# Patient Record
Sex: Female | Born: 1960 | Race: Black or African American | Hispanic: No | Smoking: Former smoker
Health system: Southern US, Community
[De-identification: ages and names within clinical notes are randomized; demographics above are authoritative.]

## PROBLEM LIST (undated history)

## (undated) DIAGNOSIS — E119 Type 2 diabetes mellitus without complications: Secondary | ICD-10-CM

## (undated) DIAGNOSIS — F329 Major depressive disorder, single episode, unspecified: Secondary | ICD-10-CM

## (undated) DIAGNOSIS — F419 Anxiety disorder, unspecified: Secondary | ICD-10-CM

## (undated) DIAGNOSIS — B009 Herpesviral infection, unspecified: Secondary | ICD-10-CM

## (undated) DIAGNOSIS — F32A Depression, unspecified: Secondary | ICD-10-CM

## (undated) DIAGNOSIS — J302 Other seasonal allergic rhinitis: Secondary | ICD-10-CM

## (undated) HISTORY — PX: TUBAL LIGATION: SHX77

## (undated) HISTORY — PX: CYST EXCISION: SHX5701

---

## 2013-12-29 ENCOUNTER — Emergency Department (HOSPITAL_COMMUNITY): Payer: 59

## 2013-12-29 ENCOUNTER — Encounter (HOSPITAL_COMMUNITY): Payer: Self-pay | Admitting: Emergency Medicine

## 2013-12-29 ENCOUNTER — Emergency Department (HOSPITAL_COMMUNITY)
Admission: EM | Admit: 2013-12-29 | Discharge: 2013-12-30 | Disposition: A | Discharge status: Home / Self Care | Payer: 59 | Attending: Emergency Medicine | Admitting: Emergency Medicine

## 2013-12-29 DIAGNOSIS — Z87891 Personal history of nicotine dependence: Secondary | ICD-10-CM | POA: Insufficient documentation

## 2013-12-29 DIAGNOSIS — R0602 Shortness of breath: Secondary | ICD-10-CM | POA: Insufficient documentation

## 2013-12-29 DIAGNOSIS — R079 Chest pain, unspecified: Secondary | ICD-10-CM | POA: Diagnosis present

## 2013-12-29 DIAGNOSIS — R002 Palpitations: Secondary | ICD-10-CM | POA: Diagnosis present

## 2013-12-29 DIAGNOSIS — R0789 Other chest pain: Secondary | ICD-10-CM | POA: Insufficient documentation

## 2013-12-29 DIAGNOSIS — Z79899 Other long term (current) drug therapy: Secondary | ICD-10-CM | POA: Insufficient documentation

## 2013-12-29 HISTORY — DX: Other seasonal allergic rhinitis: J30.2

## 2013-12-29 LAB — BASIC METABOLIC PANEL
BUN: 6 mg/dL (ref 6–23)
CHLORIDE: 103 meq/L (ref 96–112)
CO2: 25 meq/L (ref 19–32)
CREATININE: 0.78 mg/dL (ref 0.50–1.10)
Calcium: 9.3 mg/dL (ref 8.4–10.5)
GFR calc Af Amer: >90 mL/min (ref >90)
GFR calc non Af Amer: >90 mL/min (ref >90)
Glucose, Bld: 105 mg/dL — ABNORMAL HIGH (ref 70–99)
Potassium: 3.6 mEq/L — ABNORMAL LOW (ref 3.7–5.3)
Sodium: 142 mEq/L (ref 137–147)

## 2013-12-29 LAB — CBC
HEMATOCRIT: 37.6 % (ref 36.0–46.0)
HEMOGLOBIN: 12.3 g/dL (ref 12.0–15.0)
MCH: 23 pg — ABNORMAL LOW (ref 26.0–34.0)
MCHC: 32.7 g/dL (ref 30.0–36.0)
MCV: 70.4 fL — AB (ref 78.0–100.0)
Platelets: 296 10*3/uL (ref 150–400)
RBC: 5.34 MIL/uL — ABNORMAL HIGH (ref 3.87–5.11)
RDW: 14.9 % (ref 11.5–15.5)
WBC: 12.9 10*3/uL — AB (ref 4.0–10.5)

## 2013-12-29 LAB — PRO B NATRIURETIC PEPTIDE: Pro B Natriuretic peptide (BNP): 70 pg/mL (ref 0–125)

## 2013-12-29 LAB — I-STAT TROPONIN, ED: Troponin i, poc: 0 ng/mL (ref 0.00–0.08)

## 2013-12-29 LAB — TROPONIN I

## 2013-12-29 MED ORDER — OXYCODONE-ACETAMINOPHEN 5-325 MG PO TABS
1.0000 | ORAL_TABLET | Freq: Once | ORAL | Status: AC
  Administered 2013-12-29: 1 via ORAL
  Filled 2013-12-29: qty 1

## 2013-12-29 NOTE — ED Provider Notes (Signed)
CSN: [id number]     Arrival date & time 12/29/13  1933 History   First MD Initiated Contact with Patient 12/29/13 2028     Chief Complaint  Patient presents with  . Chest Pain     (Consider location/radiation/quality/duration/timing/severity/associated sxs/prior Treatment) Patient is a 53 y.o. female presenting with chest pain. The history is provided by the patient.  Chest Pain Pain location:  L chest Pain quality: pressure   Pain radiates to:  Does not radiate Pain radiates to the back: no   Pain severity:  Mild Onset quality:  Gradual Duration:  1 day Timing:  Intermittent Progression:  Unchanged Chronicity:  New Context: at rest   Relieved by:  Nothing Worsened by:  Nothing tried Ineffective treatments:  None tried Associated symptoms: shortness of breath (mild)   Associated symptoms: no abdominal pain, no back pain, no cough, no dizziness, no fatigue, no fever, no headache, no nausea and not vomiting     Past Medical History  Diagnosis Date  . Seasonal allergies    Past Surgical History  Procedure Laterality Date  . Cyst excision     No family history on file. History  Substance Use Topics  . Smoking status: Never Smoker   . Smokeless tobacco: Not on file  . Alcohol Use: No   OB History   Grav Para Term Preterm Abortions TAB SAB Ect Mult Living                 Review of Systems  Constitutional: Negative for fever and fatigue.  HENT: Negative for congestion and drooling.   Eyes: Negative for pain.  Respiratory: Positive for shortness of breath (mild). Negative for cough.   Cardiovascular: Positive for chest pain.  Gastrointestinal: Negative for nausea, vomiting, abdominal pain and diarrhea.  Genitourinary: Negative for dysuria and hematuria.  Musculoskeletal: Negative for back pain, gait problem and neck pain.  Skin: Negative for color change.  Neurological: Negative for dizziness and headaches.  Hematological: Negative for adenopathy.   Psychiatric/Behavioral: Negative for behavioral problems.  All other systems reviewed and are negative.      Allergies  Sulfa antibiotics  Home Medications   Current Outpatient Rx  Name  Route  Sig  Dispense  Refill  . albuterol (PROVENTIL HFA;VENTOLIN HFA) 108 (90 BASE) MCG/ACT inhaler   Inhalation   Inhale 1-2 puffs into the lungs every 6 (six) hours as needed for wheezing or shortness of breath.         . estradiol (ESTRACE) 1 MG tablet   Oral   Take 1 mg by mouth daily.         . fluticasone (FLONASE) 50 MCG/ACT nasal spray   Each Nare   Place 2 sprays into both nostrils daily as needed for allergies or rhinitis.         Marland Kitchen ibuprofen (ADVIL,MOTRIN) 200 MG tablet   Oral   Take 200 mg by mouth daily as needed for mild pain.          BP 120/96  Pulse 84  Temp(Src) 99.1 F (37.3 C) (Oral)  Resp 18  SpO2 97% Physical Exam  Nursing note and vitals reviewed. Constitutional: She is oriented to person, place, and time. She appears well-developed and well-nourished.  HENT:  Head: Normocephalic.  Mouth/Throat: No oropharyngeal exudate.  Eyes: Conjunctivae and EOM are normal. Pupils are equal, round, and reactive to light.  Neck: Normal range of motion. Neck supple.  Cardiovascular: Normal rate, regular rhythm, normal heart sounds and intact  distal pulses.  Exam reveals no gallop and no friction rub.   No murmur heard. Pulmonary/Chest: Effort normal and breath sounds normal. No respiratory distress. She has no wheezes.  Abdominal: Soft. Bowel sounds are normal. There is no tenderness. There is no rebound and no guarding.  Musculoskeletal: Normal range of motion. She exhibits no edema and no tenderness.  Neurological: She is alert and oriented to person, place, and time.  Skin: Skin is warm and dry.  Psychiatric: She has a normal mood and affect. Her behavior is normal.    ED Course  Procedures (including critical care time) Labs Review Labs Reviewed  CBC -  Abnormal; Notable for the following:    WBC 12.9 (*)    RBC 5.34 (*)    MCV 70.4 (*)    MCH 23.0 (*)    All other components within normal limits  BASIC METABOLIC PANEL - Abnormal; Notable for the following:    Potassium 3.6 (*)    Glucose, Bld 105 (*)    All other components within normal limits  PRO B NATRIURETIC PEPTIDE  I-STAT TROPOININ, ED   Imaging Review Dg Chest 2 View  12/29/2013   CLINICAL DATA:  Chest pain  EXAM: CHEST  2 VIEW  COMPARISON:  None.  FINDINGS: Lungs are clear. Heart size and pulmonary vascularity are normal. No adenopathy. No pneumothorax. There is midthoracic dextroscoliosis.  IMPRESSION: Scoliosis.  No edema or consolidation.   Electronically Signed   By: Bretta Bang M.D.   On: 12/29/2013 20:21     EKG Interpretation   Date/Time:  Friday December 29 2013 19:39:15 EDT Ventricular Rate:  79 PR Interval:  156 QRS Duration: 72 QT Interval:  384 QTC Calculation: 440 R Axis:   58 Text Interpretation:  Normal sinus rhythm Normal ECG no previous for  comparison Confirmed by Donis Pinder  MD, Ryeleigh Santore ([id number]) on 12/29/2013 8:46:05  PM      MDM   Final diagnoses:  Chest pain  Palpitations    9:07 PM 53 y.o. female who presents with palpitations and chest pressure which began yesterday while at rest. She notes that her symptoms persisted for several hours. She notes that she has had intermittent symptoms today. She currently rates her pain as a 4/10. She is afebrile and vital signs are unremarkable here. She was a previous smoker, has a family history of heart disease. Wells neg. she appears well on exam. Will get screening labwork and pain control with a Percocet.  12:03 AM: Pt feeling better. Labs non-contrib. Delta trop neg. Low risk for MACE per HEART score. Will provide pain medicine at pt's request and rec close f/u w/ pcp.  I have discussed the diagnosis/risks/treatment options with the patient and believe the pt to be eligible for discharge home to  follow-up with pcp next week. We also discussed returning to the ED immediately if new or worsening sx occur. We discussed the sx which are most concerning (e.g., worsening pain, sob, fever) that necessitate immediate return. Medications administered to the patient during their visit and any new prescriptions provided to the patient are listed below.  Medications given during this visit Medications  oxyCODONE-acetaminophen (PERCOCET/ROXICET) 5-325 MG per tablet 1 tablet (1 tablet Oral Given 12/29/13 2110)    Discharge Medication List as of 12/30/2013 12:16 AM    START taking these medications   Details  oxyCODONE-acetaminophen (PERCOCET) 5-325 MG per tablet Take 1 tablet by mouth every 6 (six) hours as needed for moderate pain., Starting 12/30/2013,  Until Discontinued, Print         Junius ArgyleForrest S Jaqueline Uber, MD 12/30/13 1316

## 2013-12-29 NOTE — ED Notes (Signed)
Pt. C/o chest pain started last night. States it was intermittent but has been continuous for the past 4 hours. States "It feels like fluttering in my chest", also reports tenderness to left chest. Reports nausea and increase in pain with deep breathing. Denies hx of heart problems.

## 2013-12-29 NOTE — ED Notes (Signed)
Called Christiane HaJonathan [phone] (pt's husband) per pt's request and notified him that pt is here.

## 2013-12-29 NOTE — ED Notes (Signed)
Pt. reports intermittent mid / left chest pain with SOB /nausea and diaphoresis onset last night .

## 2013-12-30 DIAGNOSIS — R002 Palpitations: Secondary | ICD-10-CM | POA: Diagnosis present

## 2013-12-30 DIAGNOSIS — R079 Chest pain, unspecified: Secondary | ICD-10-CM | POA: Diagnosis present

## 2013-12-30 MED ORDER — OXYCODONE-ACETAMINOPHEN 5-325 MG PO TABS: 1.0000 | ORAL_TABLET | Freq: Four times a day (QID) | ORAL | Status: DC | PRN

## 2013-12-30 NOTE — Discharge Instructions (Signed)
°Emergency Department Resource Guide °1) Find a Doctor and Pay Out of Pocket °Although you won't have to find out who is covered by your insurance plan, it is a good idea to ask around and get recommendations. You will then need to call the office and see if the doctor you have chosen will accept you as a new patient and what types of options they offer for patients who are self-pay. Some doctors offer discounts or will set up payment plans for their patients who do not have insurance, but you will need to ask so you aren't surprised when you get to your appointment. ° °2) Contact Your Local Health Department °Not all health departments have doctors that can see patients for sick visits, but many do, so it is worth a call to see if yours does. If you don't know where your local health department is, you can check in your phone book. The CDC also has a tool to help you locate your state's health department, and many state websites also have listings of all of their local health departments. ° °3) Find a Walk-in Clinic °If your illness is not likely to be very severe or complicated, you may want to try a walk in clinic. These are popping up all over the country in pharmacies, drugstores, and shopping centers. They're usually staffed by nurse practitioners or physician assistants that have been trained to treat common illnesses and complaints. They're usually fairly quick and inexpensive. However, if you have serious medical issues or chronic medical problems, these are probably not your best option. ° °No Primary Care Doctor: °- Call [facility] at  832-8000 - they can help you locate a primary care doctor that  accepts your insurance, provides certain services, etc. °- Physician Referral Service- 1-800-533-3463 ° °Chronic Pain Problems: °Organization         Address  Phone   Notes  °[facility]  (336) 297-2271 Patients need to be referred by their primary care doctor.  ° °Medication  Assistance: °Organization         Address  Phone   Notes  °[place] [facility] [address] °[place], [place] [address] (336) 641-8030 --Must be a resident of [place] °-- Must have NO insurance coverage whatsoever (no Medicaid/ Medicare, etc.) °-- The pt. MUST have a primary care doctor that directs their care regularly and follows them in the community °  °[facility]  (866) 331-1348   °[employer]  (888) 892-1162   ° °Agencies that provide inexpensive medical care: °Organization         Address  Phone   Notes  °[facility]  (336) 832-8035   °[facility]    (336) 832-7272   °[facility] [address] °[place], [place] [address] (336) 832-4777   °[facility] [address], °[place] (336) 271-4999   °[facility]    (336) 373-0678   °[facility]    (336) 272-1050   °[facility] ° [address], [place] Phone:  (336) 832-4444, Fax:  (336) 832-4440 Hours of Operation:  9 am - 6 pm, M-F.  Also accepts Medicaid/Medicare and self-pay.  °[facility] ° [address],  Phone: (336) 832-3150, Fax: (336) 832-3151. Hours of Operation:  8:30 am - 5:30 pm, M-F.  Also accepts Medicaid and self-pay.  °[facility] [place] [address], [place] Phone: (336) 878-6027   °[facility] [address], [place], [place] (336)723-1848, Ext. 123 Mondays & Thursdays: 7-9 AM.  First 15 patients are seen on a first come, first serve basis. °  ° °Medicaid-accepting [place] Providers: ° °Organization         Address  Phone   Notes  °[facility] [address], [place] (336) 641-2100 Also accepts self-pay patients.  °[facility] [address], [place] ° (336) 856-9996   °[facility] [address], [place]  (336) 288-8857   °[facility] [address] [place] [address], [place] (336) 299-7000   °Veita Bland [address], [place]  ° (336) 373-1557 Only accepts [place] Access Medicaid patients after they have their name applied to their card.  ° °Self-Pay (no insurance) in [place]: ° °Organization         Address  Phone   Notes  °Sickle Cell Patients, [facility] [address], [place] (336) 832-1970   °[facility] [address], [place] (336) 832-4400   °[facility] ° [address], [place] (336) 992-4800   °[facility]/Dr. Osei-Bonsu ° [address] [place] [address], [place] or [address], [place] (336) 841-8500 Phone number for both [place] and [place] locations is the same.  °[facility] [address], [place] (336) 299-0000   °[facility] [address] [place] [address], [place] or [address] (336) 852-7530 °(336) 878-2260   °[facility] [address], [place] (336) 350-1642, phone; (336) 294-5005, fax Sees patients 1st and 3rd Saturday of every month.  Must not qualify for public or private insurance (i.e. Medicaid, Medicare, [place] Health Choice, Veterans' Benefits) • Household income should be no more than 200% of the poverty level •The clinic cannot treat you if you are pregnant or think you are pregnant • Sexually transmitted diseases are not treated at the clinic.  ° ° °Dental Care: °Organization         Address  Phone  Notes  °[place] [facility] [address], [place] (336) 641-6152 Accepts children up to age 21 who are enrolled in Medicaid or [place] Health Choice; pregnant women with a Medicaid card; and children who have applied for Medicaid or [place] Health Choice, but were declined, whose parents can pay a reduced fee at time of service.  °[place]  [facility] [place]  [address], [place] (336) 641-7733 Accepts children up to age 21 who are enrolled in Medicaid or [place] Health Choice; pregnant women with a Medicaid card; and children who have applied for Medicaid or [place] Health Choice, but were declined, whose parents can pay a reduced fee at time of service.  °[facility] ° [address], [place] (336) 641-4533 Patients are seen by appointment only. Walk-ins are not accepted. [facility] will see patients 18 years of age and older. °Monday - Tuesday (8am-5pm) °Most Wednesdays (8:30-5pm) °$30 per visit, cash only  °[facility] ° [address], [place] (336) 641-4533 Patients are seen by appointment only. Walk-ins are not accepted. [facility] will see patients 18 years of age and older. °One   Wednesday Evening (Monthly: Volunteer Based).  $30 per visit, cash only  °[facility]  (919) 537-3737 for adults; Children under age 4, call Graduate Pediatric Dentistry at (919) [phone]. Children aged 4-14, please call (919) [phone] to request a pediatric application. ° Dental services are provided in all areas of dental care including fillings, crowns and bridges, complete and partial dentures, implants, gum treatment, root canals, and extractions. Preventive care is also provided. Treatment is provided to both adults and children. °Patients are selected via a lottery and there is often a waiting list. °  °[facility] [address], °[place] ° (336) 763-8833 [email or web address] °  °[facility] [address], [place], [place] (336)723-1848, Ext. 123 Second and Fourth Thursday of each month, opens at 6:30 AM; Clinic ends at 9 AM.  Patients are seen on a first-come first-served basis, and a limited number are seen during each clinic.  ° °[facility] ° [address], [place], [place] (336) 723-7904    Eligibility Requirements °You must have lived in [place], [place], or [place] counties for at least the last three months. °  You cannot be eligible for state or federal sponsored healthcare insurance, including Veterans Administration, Medicaid, or Medicare. °  You generally cannot be eligible for healthcare insurance through your employer.  °  How to apply: °Eligibility screenings are held every Tuesday and Wednesday afternoon from 1:00 pm until 4:00 pm. You do not need an appointment for the interview!  °[facility] [address], [place], [place] 336-631-2330   °[facility]  336-342-8273   °[facility]  336-703-3100   °[facility]  336-570-6415   ° °Behavioral Health Resources in the Community: °Intensive Outpatient Programs °Organization         Address  Phone  Notes  °[facility] [address], [place], [place] 336-878-6098   °[facility] [address], [place], [place] 336-832-9800   °[facility] [address], [place], [place] ° 336-882-2125   °[facility] [address],  °[place], [place] 1-800-853-5163 or 336-641-4981   °Substance Abuse Resources °Organization         Address  Phone  Notes  °[facility]  336-882-2125   °[facility]  336-784-9470   °[facility]  336-285-9073   °[facility]  336-845-3988   °Residential & Outpatient Substance Abuse Program  1-800-659-3381   °Psychological Services °Organization         Address  Phone  Notes  °[facility]  336- 832-9600   °[facility]  336- 378-7881   °[facility] [address], [place] 1-800-853-5163 or 336-641-4981   ° °Mobile Crisis Teams °Organization         Address  Phone  Notes  °[facility], Mobile Crisis Care Unit  1-877-626-1772   °Assertive °[facility] ° [address]  [place], [place] 336-834-9664   °Sharon DeEsch [address] ° [place] 336-554-5454   ° °Self-Help/Support Groups °Organization         Address  Phone             Notes  °[facility]  - variety of support groups  336- 373-1402 Call for more information  °Narcotics Anonymous (NA), [facility] [address], °[place]  2 meetings at this location  ° °  Residential Treatment Programs °Organization         Address  Phone  Notes  °[facility] [address],    °[place]  1-866-801-8205   °[facility] ° [address], [place], [place] 704-293-8524   °[facility] [address], [place] 336-845-3988 Admissions: 8am-3pm M-F  °[facility] [address]    °[place], [place] 336-841-1104   °[facility] [address], [place], [place] 336-379-7146   °[facility] [address]  °[place], [place] 336-285-9073   °[facility] - Intensive Outpatient [address], [place], [place] 336-852-3033   °[facility] [address]  °[place], [place] 1-877-615-2722 or 336-784-9470   °[facility] [address] [place], [place] 336-227-7417 Accepts Medicaid  °[facility] [address]  °[place] 1-800-659-3381 Substance Abuse/Addiction Treatment  ° °[place] Behavioral Health Resources °Organization         Address  Phone  Notes  °[facility]  (888) 581-9988   °Julie Brannon, PhD [address] [place], [place]   (336) 349-5553 or (336) 951-0000   °[facility]   [address] °[place], [place] (336) 349-4454   °[facility] [address], [place], [place] (336) 342-8316 Insurance/Medicaid/sponsorship through [facility]  °[facility] [address]                                    [place], [place] (336) 342-8316 Therapy/tele-psych/case    °[facility] [address]  ° [place], [place] (336) 349-2233    °Dr. Arfeen  (336) 349-4544   °[facility] [place]  [employer] [place] [facility] 1) [address], [place] °2) [address], [place] °3)  [address], [place] (336) 349-3220 °(336) 342-7768 ° °(336) 342-8140   °[place] Child Abuse [facility] (336) 342-1394 or (336) 342-3537 (After Hours)    ° ° °

## 2014-07-05 ENCOUNTER — Emergency Department (HOSPITAL_COMMUNITY)
Admission: EM | Admit: 2014-07-05 | Discharge: 2014-07-05 | Disposition: A | Discharge status: Home / Self Care | Payer: 59 | Attending: Emergency Medicine | Admitting: Emergency Medicine

## 2014-07-05 ENCOUNTER — Encounter (HOSPITAL_COMMUNITY): Payer: Self-pay | Admitting: Emergency Medicine

## 2014-07-05 DIAGNOSIS — R531 Weakness: Secondary | ICD-10-CM | POA: Insufficient documentation

## 2014-07-05 DIAGNOSIS — Z7951 Long term (current) use of inhaled steroids: Secondary | ICD-10-CM | POA: Insufficient documentation

## 2014-07-05 DIAGNOSIS — R55 Syncope and collapse: Secondary | ICD-10-CM | POA: Diagnosis not present

## 2014-07-05 DIAGNOSIS — Z791 Long term (current) use of non-steroidal anti-inflammatories (NSAID): Secondary | ICD-10-CM | POA: Diagnosis not present

## 2014-07-05 DIAGNOSIS — Z79899 Other long term (current) drug therapy: Secondary | ICD-10-CM | POA: Diagnosis not present

## 2014-07-05 DIAGNOSIS — N390 Urinary tract infection, site not specified: Secondary | ICD-10-CM

## 2014-07-05 DIAGNOSIS — Z792 Long term (current) use of antibiotics: Secondary | ICD-10-CM | POA: Insufficient documentation

## 2014-07-05 DIAGNOSIS — R42 Dizziness and giddiness: Secondary | ICD-10-CM | POA: Diagnosis present

## 2014-07-05 DIAGNOSIS — Z8709 Personal history of other diseases of the respiratory system: Secondary | ICD-10-CM | POA: Insufficient documentation

## 2014-07-05 LAB — CBC
HCT: 37.4 % (ref 36.0–46.0)
Hemoglobin: 12.1 g/dL (ref 12.0–15.0)
MCH: 22.4 pg — ABNORMAL LOW (ref 26.0–34.0)
MCHC: 32.4 g/dL (ref 30.0–36.0)
MCV: 69.1 fL — ABNORMAL LOW (ref 78.0–100.0)
Platelets: 289 10*3/uL (ref 150–400)
RBC: 5.41 MIL/uL — AB (ref 3.87–5.11)
RDW: 14.7 % (ref 11.5–15.5)
WBC: 14 10*3/uL — ABNORMAL HIGH (ref 4.0–10.5)

## 2014-07-05 LAB — URINALYSIS, ROUTINE W REFLEX MICROSCOPIC
BILIRUBIN URINE: NEGATIVE
Glucose, UA: NEGATIVE mg/dL
Ketones, ur: NEGATIVE mg/dL
NITRITE: POSITIVE — AB
Protein, ur: NEGATIVE mg/dL
SPECIFIC GRAVITY, URINE: 1.019 (ref 1.005–1.030)
UROBILINOGEN UA: 0.2 mg/dL (ref 0.0–1.0)
pH: 5 (ref 5.0–8.0)

## 2014-07-05 LAB — URINE MICROSCOPIC-ADD ON

## 2014-07-05 LAB — BASIC METABOLIC PANEL
Anion gap: 12 (ref 5–15)
BUN: 10 mg/dL (ref 6–23)
CHLORIDE: 101 meq/L (ref 96–112)
CO2: 27 mEq/L (ref 19–32)
Calcium: 9.7 mg/dL (ref 8.4–10.5)
Creatinine, Ser: 0.87 mg/dL (ref 0.50–1.10)
GFR calc non Af Amer: 76 mL/min — ABNORMAL LOW (ref >90)
GFR, EST AFRICAN AMERICAN: 88 mL/min — AB (ref >90)
Glucose, Bld: 98 mg/dL (ref 70–99)
Potassium: 3.6 mEq/L — ABNORMAL LOW (ref 3.7–5.3)
Sodium: 140 mEq/L (ref 137–147)

## 2014-07-05 LAB — CBG MONITORING, ED: Glucose-Capillary: 146 mg/dL — ABNORMAL HIGH (ref 70–99)

## 2014-07-05 LAB — I-STAT TROPONIN, ED: Troponin i, poc: 0 ng/mL (ref 0.00–0.08)

## 2014-07-05 MED ORDER — CEPHALEXIN 500 MG PO CAPS: 500.0000 mg | ORAL_CAPSULE | Freq: Three times a day (TID) | ORAL | Status: DC

## 2014-07-05 MED ORDER — SODIUM CHLORIDE 0.9 % IV BOLUS (SEPSIS)
1000.0000 mL | Freq: Once | INTRAVENOUS | Status: AC
  Administered 2014-07-05: 1000 mL via INTRAVENOUS

## 2014-07-05 NOTE — Discharge Instructions (Signed)
Urinary Tract Infection Urinary tract infections (UTIs) can develop anywhere along your urinary tract. Your urinary tract is your body's drainage system for removing wastes and extra water. Your urinary tract includes two kidneys, two ureters, a bladder, and a urethra. Your kidneys are a pair of bean-shaped organs. Each kidney is about the size of your fist. They are located below your ribs, one on each side of your spine. CAUSES Infections are caused by microbes, which are microscopic organisms, including fungi, viruses, and bacteria. These organisms are so small that they can only be seen through a microscope. Bacteria are the microbes that most commonly cause UTIs. SYMPTOMS  Symptoms of UTIs may vary by age and gender of the patient and by the location of the infection. Symptoms in young women typically include a frequent and intense urge to urinate and a painful, burning feeling in the bladder or urethra during urination. Older women and men are more likely to be tired, shaky, and weak and have muscle aches and abdominal pain. A fever may mean the infection is in your kidneys. Other symptoms of a kidney infection include pain in your back or sides below the ribs, nausea, and vomiting. DIAGNOSIS To diagnose a UTI, your caregiver will ask you about your symptoms. Your caregiver also will ask to provide a urine sample. The urine sample will be tested for bacteria and white blood cells. White blood cells are made by your body to help fight infection. TREATMENT  Typically, UTIs can be treated with medication. Because most UTIs are caused by a bacterial infection, they usually can be treated with the use of antibiotics. The choice of antibiotic and length of treatment depend on your symptoms and the type of bacteria causing your infection. HOME CARE INSTRUCTIONS  If you were prescribed antibiotics, take them exactly as your caregiver instructs you. Finish the medication even if you feel better after you  have only taken some of the medication.  Drink enough water and fluids to keep your urine clear or pale yellow.  Avoid caffeine, tea, and carbonated beverages. They tend to irritate your bladder.  Empty your bladder often. Avoid holding urine for long periods of time.  Empty your bladder before and after sexual intercourse.  After a bowel movement, women should cleanse from front to back. Use each tissue only once. SEEK MEDICAL CARE IF:   You have back pain.  You develop a fever.  Your symptoms do not begin to resolve within 3 days. SEEK IMMEDIATE MEDICAL CARE IF:   You have severe back pain or lower abdominal pain.  You develop chills.  You have nausea or vomiting.  You have continued burning or discomfort with urination. MAKE SURE YOU:   Understand these instructions.  Will watch your condition.  Will get help right away if you are not doing well or get worse. Document Released: 06/24/2005 Document Revised: 03/15/2012 Document Reviewed: 10/23/2011 [facility] Patient Information 2015 [place], [place]. This information is not intended to replace advice given to you by your health care provider. Make sure you discuss any questions you have with your health care provider.   Cardiac Event Monitoring A cardiac event monitor is a small recording device used to help detect abnormal heart rhythms (arrhythmias). The monitor is used to record heart rhythm when noticeable symptoms such as the following occur:  Fast heartbeats (palpitations), such as heart racing or fluttering.  Dizziness.  Fainting or light-headedness.  Unexplained weakness. The monitor is wired to two electrodes placed on your  chest. Electrodes are flat, sticky disks that attach to your skin. The monitor can be worn for up to 30 days. You will wear the monitor at all times, except when bathing.  HOW TO USE YOUR CARDIAC EVENT MONITOR A technician will prepare your chest for the electrode placement. The technician  will show you how to place the electrodes, how to work the monitor, and how to replace the batteries. Take time to practice using the monitor before you leave the office. Make sure you understand how to send the information from the monitor to your health care provider. This requires a telephone with a landline, not a cell phone. You need to:  Wear your monitor at all times, except when you are in water:  Do not get the monitor wet.  Take the monitor off when bathing. Do not swim or use a hot tub with it on.  Keep your skin clean. Do not put body lotion or moisturizer on your chest.  Change the electrodes daily or any time they stop sticking to your skin. You might need to use tape to keep them on.  It is possible that your skin under the electrodes could become irritated. To keep this from happening, try to put the electrodes in slightly different places on your chest. However, they must remain in the area under your left breast and in the upper right section of your chest.  Make sure the monitor is safely clipped to your clothing or in a location close to your body that your health care provider recommends.  Press the button to record when you feel symptoms of heart trouble, such as dizziness, weakness, light-headedness, palpitations, thumping, shortness of breath, unexplained weakness, or a fluttering or racing heart. The monitor is always on and records what happened slightly before you pressed the button, so do not worry about being too late to get good information.  Keep a diary of your activities, such as walking, doing chores, and taking medicine. It is especially important to note what you were doing when you pushed the button to record your symptoms. This will help your health care provider determine what might be contributing to your symptoms. The information stored in your monitor will be reviewed by your health care provider alongside your diary entries.  Send the recorded  information as recommended by your health care provider. It is important to understand that it will take some time for your health care provider to process the results.  Change the batteries as recommended by your health care provider. SEEK IMMEDIATE MEDICAL CARE IF:   You have chest pain.  You have extreme difficulty breathing or shortness of breath.  You develop a very fast heartbeat that persists.  You develop dizziness that does not go away.  You faint or constantly feel you are about to faint. Document Released: 06/23/2008 Document Revised: 01/29/2014 Document Reviewed: 03/13/2013 [facility] Patient Information 2015 [place], [place]. This information is not intended to replace advice given to you by your health care provider. Make sure you discuss any questions you have with your health care provider.   Emergency Department Resource Guide 1) Find a Doctor and Pay Out of Pocket Although you won't have to find out who is covered by your insurance plan, it is a good idea to ask around and get recommendations. You will then need to call the office and see if the doctor you have chosen will accept you as a new patient and what types of options they offer  for patients who are self-pay. Some doctors offer discounts or will set up payment plans for their patients who do not have insurance, but you will need to ask so you aren't surprised when you get to your appointment.  2) Contact Your Local Health Department Not all health departments have doctors that can see patients for sick visits, but many do, so it is worth a call to see if yours does. If you don't know where your local health department is, you can check in your phone book. The CDC also has a tool to help you locate your state's health department, and many state websites also have listings of all of their local health departments.  3) Find a Walk-in Clinic If your illness is not likely to be very severe or complicated, you may want to  try a walk in clinic. These are popping up all over the country in pharmacies, drugstores, and shopping centers. They're usually staffed by nurse practitioners or physician assistants that have been trained to treat common illnesses and complaints. They're usually fairly quick and inexpensive. However, if you have serious medical issues or chronic medical problems, these are probably not your best option.  No Primary Care Doctor: - Call Health Connect at  [phone] - they can help you locate a primary care doctor that  accepts your insurance, provides certain services, etc. - Physician Referral Service- [phone]  Chronic Pain Problems: Organization         Address  Phone   Notes  [facility]  [phone] Patients need to be referred by their primary care doctor.   Medication Assistance: Organization         Address  Phone   Notes  [facility] Medication [facility] [address] [place] [place], [place] [address] [phone] --Must be a resident of [facility] -- Must have NO insurance coverage whatsoever (no Medicaid/ Medicare, etc.) -- The pt. MUST have a primary care doctor that directs their care regularly and follows them in the community   [facility]  [phone]   [employer]  [phone]    Agencies that provide inexpensive medical care: Organization         Address  Phone   Notes  [facility]  [phone]   [facility]    [phone]   [facility] [address] [place], [place] [address] [phone]   [facility] [address] [place] [place] [phone]   Planned Parenthood    [phone]   [facility]    [phone]   [facility] and [facility]  [address], [place] Phone:  [phone], Fax:  [phone] Hours of Operation:  9 am - 6 pm, M-F.  Also accepts Medicaid/Medicare and self-pay.  [facility]  [address], [place] Phone: [phone], Fax: [phone]. Hours of Operation:  8:30 am - 5:30 pm, M-F.  Also accepts Medicaid and self-pay.  [facility] [place] [address] [place] Phone: [phone]   [facility] [address] Natasha Bence [place], [place] [phone], Ext. [phone] Mondays & Thursdays: 7-9 AM.  First 15 patients are seen on a first come, first serve basis.    Medicaid-accepting [facility] Providers:  Organization         Address  Phone   Notes  [employer]  [address], [place] [phone] Also accepts self-pay patients.  [facility] [address] Laurell Josephs [place], [place]  [phone]   [facility] [address], [place] [phone]   [facility] [address], [place] [phone]   Renaye Rakers [address] [place] [address], [place]   [phone] Only accepts [place] Access [place] patients after they have their name applied to their card.   Self-Pay (no insurance) in [facility]:  Organization         Address  Phone   Notes  Sickle Cell Patients, [facility] [address] [place], [place] [phone]   [facility]gent Care [address] [place], [place] [phone]   [facility] [place]  [address], [place] [phone]   [facility]/Dr. Osei-Bonsu  [address], [place] or [address], [place] [phone] Phone number for both [place] and [place] locations is the same.  [facility] [address], [place] [phone]   [facility] [address], [place] or [address] [phone]   [facility] [address], [place] [phone], phone; [phone], fax  Sees patients 1st and 3rd Saturday of every month.  Must not qualify for public or private insurance (i.e. Medicaid, Medicare, [place] Health Choice, Veterans' Benefits)  Household income should be no more than 200% of the poverty level The clinic cannot treat you if you are pregnant or think you are pregnant  Sexually transmitted diseases are not treated at the clinic.    Dental Care: Organization         Address  Phone  Notes  [facility] Department of [facility] [address] [place], [place] [phone] Accepts children up to age 2 who are enrolled in [place] or [place] Health Choice; pregnant women with a Medicaid card; and children who have applied for Medicaid or [place] Health Choice, but were declined, whose parents can pay a reduced fee at time of service.  [facility] Department of [facility]  [address], [place] [phone] Accepts children up to age 23 who are enrolled in [place] or [place] Health Choice; pregnant women with a Medicaid card; and children who have applied for Medicaid or [place] Health Choice, but were declined, whose parents can pay a reduced fee at time of service.  [facility]  [address] [place], [place] [phone] Patients are seen by appointment only. Walk-ins are not accepted. [facility] will see patients 41 years of age and older. Monday - Tuesday (8am-5pm) Most Wednesdays (8:30-5pm) $30 per visit, cash only  [facility]  [address], [facility] [phone] Patients are seen by appointment only. Walk-ins are not accepted. [facility] will see patients 76 years of age and older. One Wednesday Evening (Monthly: Volunteer Based).  $30 per visit, cash only  [employer]  [phone] for adults; Children under age 62, call Graduate Pediatric Dentistry at [phone]. Children aged 66-14, please call [phone] to  request a pediatric application.  Dental services are provided in all areas of dental care including fillings, crowns and bridges, complete and partial dentures, implants, gum  treatment, root canals, and extractions. Preventive care is also provided. Treatment is provided to both adults and children. Patients are selected via a lottery and there is often a waiting list.   [facility] [address], [place]  [phone] [email or web address]   [facility] [address] [place], [place] [phone], Ext. [phone] Second and Fourth Thursday of each month, opens at 6:30 AM; Clinic ends at 9 AM.  Patients are seen on a first-come first-served basis, and a limited number are seen during each clinic.   [facility]  [address] Ether Griffins [place], [place] [phone]   Eligibility Requirements You must have lived in [place], [place], or [place] counties for at least the last three months.   You cannot be eligible for state or federal sponsored [place], including [employer], [place], or [employer].   You generally cannot be eligible for healthcare insurance through your employer.    How to apply: Eligibility screenings are held every Tuesday and Wednesday afternoon from 1:00 pm until 4:00 pm. You do not need an appointment for the interview!  [facility] [address], [place], [place] [phone]   [facility] Health Department  [phone]   [facility] Health Department  [phone]   [facility] Health Department  [phone]    Behavioral Health Resources in the Community: Intensive Outpatient Programs Organization         Address  Phone  Notes  [facility] [address], [place], [place] [phone]   [facility] Outpatient [address], [place], [place] [phone]   [facility] [address] [place], [place]  [phone]   [facility] [address],  [place], [place] [phone] or [phone]   Substance Abuse Resources Organization         Address  Phone  Notes  [facility]  [phone]   [facility]  [phone]   [facility]  [phone]   Floydene Flock  [phone]   Residential & Outpatient Substance Abuse Program  [phone]   Psychological Services Organization         Address  Phone  Notes  [facility] Behavioral Health  [phone]   [facility] Services  [phone]   [facility] Mental Health [address] [place] [phone] or [phone]    Mobile Crisis Teams Organization         Address  Phone  Notes  [facility], Mobile Crisis Care Unit  [phone]   [facility]  [address] [place], [place] [phone]   Doristine Locks [address] [place] [phone]    Self-Help/Support Groups Organization         Address  Phone             Notes  Mental Health Assoc. of  - variety of support groups  336- I7437963 Call for more information  Narcotics Anonymous (NA), [facility] [address], [employer] [place]  2 meetings at this location   Statistician         Address  Phone  Notes  [facility] [address],    [place]  [phone]   [facility]  [address], [place] [address], [place], [place] [phone]   [facility] [address] [place], [place] [phone] Admissions: 8am-3pm M-F  [facility] [address]    [place], [place] [phone]   [facility] [address] [place], [place], [place] [phone]   [facility] [address],  [place], [place] [phone]   [facility] - Intensive Outpatient [address]ure[address], [place], [place] [phone]   [facility] (Addiction Recovery [facility] [address] [place]  [place], [place] [phone] or [phone]   [facility] ([facility]) [address] [place], [place] [phone] Accepts Medicaid  [facility] [address]  [place] [phone] Substance Abuse/Addiction Treatment   [facility] Organization         Address  Phone  Notes  [facility]  [phone]   Angie Fava, PhD [address] Ervin Knack [place], [place]   [phone] or [phone]   [facility] Behavioral   [address] [place], [place] [phone]   [facility] [address], [place], [place] [phone] Insurance/Medicaid/sponsorship through [facility] Families [address]                                    [place], [place] [phone] Therapy/tele-psych/case  [facility] [address], [place] [phone]    Dr. Lolly Mustache  [phone]   [facility]  [employer] [facility] Dept. 1) [address], [place] 2) [address], [place] 3)  [address], [place] [phone]  [phone]   [facility] Child Abuse Hotline [phone] or [phone] (After Hours)

## 2014-07-05 NOTE — ED Notes (Signed)
Pt from work with c/o dizziness and weakness that started today at 1130 while pt was at work. Pt states brief loss of vision to bilateral eyes, denies any vision loss at this time. Pt still reports feeling weak and tire, pt states she thinks she is going through menopause. No neuro deficits noted. Pt denies any cp/sob or n/v/d.

## 2014-07-05 NOTE — ED Notes (Signed)
Pt arrives via POv from work with sudden onset of total vision loss bilatera which lasted approx 5-10 min. Pt states she sat down on the floor because she was feeling faint. Pt arrives alert, oriented x4, Neg stroke scale at present. No droop, drift, slurred speech. VSS, cbg 146, EKG done.

## 2014-07-05 NOTE — ED Provider Notes (Signed)
CSN: [id number]     Arrival date & time 07/05/14  1231 History   First MD Initiated Contact with Patient 07/05/14 1308     Chief Complaint  Patient presents with  . Dizziness  . Weakness   Patient is a 53 y.o. female presenting with dizziness and weakness.  Dizziness Associated symptoms: no blood in stool, no chest pain, no diarrhea, no nausea, no palpitations, no shortness of breath and no vomiting   Weakness Associated symptoms include fatigue and weakness. Pertinent negatives include no abdominal pain, chest pain, chills, congestion, diaphoresis, fever, nausea, numbness or vomiting.    Patient is a 53 yo female who presents to the ED with dizziness and weakness.  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  She had to sit down because she was afraid that she would pass out.  This lasted for approximately 5-10 minutes and gradually went away.  Patient states that this has never happened to her before.  Patient states that she feels better now but she is extremely fatigued and tired.  She is also complaining of a dry mouth.    Past Medical History  Diagnosis Date  . Seasonal allergies    Past Surgical History  Procedure Laterality Date  . Cyst excision     History reviewed. No pertinent family history. History  Substance Use Topics  . Smoking status: Never Smoker   . Smokeless tobacco: Not on file  . Alcohol Use: No   OB History   Grav Para Term Preterm Abortions TAB SAB Ect Mult Living                 Review of Systems  Constitutional: Positive for fatigue. Negative for fever, chills and diaphoresis.  HENT: Negative for congestion and rhinorrhea.   Respiratory: Negative for choking, chest tightness and shortness of breath.   Cardiovascular: Negative for chest pain, palpitations and leg swelling.  Gastrointestinal: Negative for  nausea, vomiting, abdominal pain, diarrhea, constipation and blood in stool.  Genitourinary: Positive for urgency and frequency. Negative for dysuria, hematuria, decreased urine volume and difficulty urinating.  Neurological: Positive for dizziness, weakness and light-headedness. Negative for syncope and numbness.  All other systems reviewed and are negative.     Allergies  Sulfa antibiotics  Home Medications   Prior to Admission medications   Medication Sig Start Date End Date Taking? Authorizing Provider  albuterol (PROVENTIL HFA;VENTOLIN HFA) 108 (90 BASE) MCG/ACT inhaler Inhale 1-2 puffs into the lungs every 6 (six) hours as needed for wheezing or shortness of breath.   Yes Historical Provider, MD  bismuth subsalicylate (PEPTO BISMOL) 262 MG/15ML suspension Take 30 mLs by mouth every 6 (six) hours as needed for indigestion.   Yes Historical Provider, MD  estradiol (ESTRACE) 1 MG tablet Take 1 mg by mouth daily.   Yes Historical Provider, MD  fluticasone (FLONASE) 50 MCG/ACT nasal spray Place 2 sprays into both nostrils daily as needed for allergies or rhinitis.   Yes Historical Provider, MD  ibuprofen (ADVIL,MOTRIN) 200 MG tablet Take 200 mg by mouth daily as needed for mild pain.   Yes Historical Provider, MD  Pseudoeph-Doxylamine-DM-APAP (NYQUIL PO) Take 30 mLs by mouth at bedtime as needed (for cold symptoms).   Yes Historical Provider, MD  Vitamin D, Ergocalciferol, (DRISDOL) 50000 UNITS CAPS capsule Take 50,000 Units by mouth every  7 (seven) days. Normally takes on Saturdays or early Sunday morning   Yes Historical Provider, MD  cephALEXin (KEFLEX) 500 MG capsule Take 1 capsule (500 mg total) by mouth 3 (three) times daily. 07/05/14   Yohana Bartha A Forcucci, PA-C   BP 117/56  Pulse 60  Temp(Src) 98.1 F (36.7 C) (Oral)  Resp 17  SpO2 100% Physical Exam  Nursing note and vitals reviewed. Constitutional: She is oriented to person, place, and time. She appears well-developed and  well-nourished. No distress.  HENT:  Head: Normocephalic and atraumatic.  Mouth/Throat: Oropharynx is clear and moist. No oropharyngeal exudate.  Eyes: Conjunctivae and EOM are normal. Pupils are equal, round, and reactive to light. No scleral icterus.  Neck: Normal range of motion. Neck supple. No JVD present. No thyromegaly present.  Cardiovascular: Normal rate, regular rhythm, normal heart sounds and intact distal pulses.  Exam reveals no gallop and no friction rub.   No murmur heard. No calf tenderness  Pulmonary/Chest: Effort normal and breath sounds normal. No respiratory distress. She has no wheezes. She has no rales. She exhibits no tenderness.  Abdominal: Soft. Bowel sounds are normal. She exhibits no distension and no mass. There is no tenderness. There is no rebound and no guarding.  Musculoskeletal: Normal range of motion.  Lymphadenopathy:    She has no cervical adenopathy.  Neurological: She is alert and oriented to person, place, and time. She has normal strength. No cranial nerve deficit or sensory deficit. Coordination normal.  Skin: Skin is warm and dry. She is not diaphoretic.  Psychiatric: She has a normal mood and affect. Her behavior is normal. Judgment and thought content normal.    ED Course  Procedures (including critical care time) Labs Review Labs Reviewed  CBC - Abnormal; Notable for the following:    WBC 14.0 (*)    RBC 5.41 (*)    MCV 69.1 (*)    MCH 22.4 (*)    All other components within normal limits  BASIC METABOLIC PANEL - Abnormal; Notable for the following:    Potassium 3.6 (*)    GFR calc non Af Amer 76 (*)    GFR calc Af Amer 88 (*)    All other components within normal limits  URINALYSIS, ROUTINE W REFLEX MICROSCOPIC - Abnormal; Notable for the following:    APPearance HAZY (*)    Hgb urine dipstick TRACE (*)    Nitrite POSITIVE (*)    Leukocytes, UA TRACE (*)    All other components within normal limits  URINE MICROSCOPIC-ADD ON -  Abnormal; Notable for the following:    Squamous Epithelial / LPF FEW (*)    Bacteria, UA MANY (*)    All other components within normal limits  CBG MONITORING, ED - Abnormal; Notable for the following:    Glucose-Capillary 146 (*)    All other components within normal limits  I-STAT TROPOININ, ED    Imaging Review No results found.   EKG Interpretation   Date/Time:  Thursday July 05 2014 12:45:20 EDT Ventricular Rate:  77 PR Interval:  156 QRS Duration: 84 QT Interval:  400 QTC Calculation: 452 R Axis:   62 Text Interpretation:  Normal sinus rhythm Normal ECG No significant change  was found Confirmed by Mirian Mo [phone]) on 07/05/2014 2:29:03 PM      MDM   Final diagnoses:  UTI (lower urinary tract infection)  Near syncope   Patient is a 53 y.o. female who presents to the ED with dizziness and  weakness. Physical exam is unremarkable.  Orthostatic vitals reveal no evidence of orthostatic hypotension.  CBC shows mild leukocytosis.  BMP shows no evidence of acute abnormalities.  Troponin negative.  CBG is normal. UA shows UTI.   Patient was treated here with 1 L NS bolus.  Patient felt better after fluids and eating.  Patient will be discharged home with keflex.  Patient to return to the ED with signs of pyelonephritis.  Patient states understanding and agreement at this time.  Patient was also seen by Dr. Littie DeedsGentry who agrees with the above plan and workup.  Patient to be given a resource list to follow-up with a PCP of her choice in the area to establish with.  Discussed the need for possible holter monitor with patient if she continues to have near syncopal episodes.       Eben Burowourtney A Forcucci, PA-C 07/05/14 1550

## 2014-07-06 NOTE — ED Provider Notes (Signed)
Medical screening examination/treatment/procedure(s) were conducted as a shared visit with non-physician practitioner(s) and myself.  I personally evaluated the patient during the encounter.   EKG Interpretation   Date/Time:  Thursday July 05 2014 12:45:20 EDT Ventricular Rate:  77 PR Interval:  156 QRS Duration: 84 QT Interval:  400 QTC Calculation: 452 R Axis:   62 Text Interpretation:  Normal sinus rhythm Normal ECG No significant change  was found Confirmed by Mirian MoGentry, Shalawn Wynder [phone]) on 07/05/2014 2:29:03 PM       Briefly, pt is a 53 y.o. female presenting with dizziness and weakness.  I performed an examination on the patient including cardiac, pulmonary, and gi systems which were unremarkable.  Labs as above unremarkable.  UA with signs of UTI.  Standard return precautions given.  DC home in stable condition.     Mirian MoMatthew Cabrini Ruggieri, MD 07/06/14 [phone]

## 2014-10-15 ENCOUNTER — Ambulatory Visit: Payer: Self-pay | Admitting: Family Medicine

## 2014-11-26 DIAGNOSIS — N959 Unspecified menopausal and perimenopausal disorder: Secondary | ICD-10-CM | POA: Insufficient documentation

## 2015-01-09 DIAGNOSIS — A6000 Herpesviral infection of urogenital system, unspecified: Secondary | ICD-10-CM | POA: Insufficient documentation

## 2015-06-05 ENCOUNTER — Other Ambulatory Visit: Payer: Self-pay | Admitting: Family Medicine

## 2015-06-05 DIAGNOSIS — Z1231 Encounter for screening mammogram for malignant neoplasm of breast: Secondary | ICD-10-CM

## 2015-06-06 DIAGNOSIS — F320 Major depressive disorder, single episode, mild: Secondary | ICD-10-CM | POA: Insufficient documentation

## 2015-07-01 ENCOUNTER — Ambulatory Visit
Admission: RE | Admit: 2015-07-01 | Discharge: 2015-07-01 | Disposition: A | Discharge status: Home / Self Care | Payer: 59 | Source: Ambulatory Visit | Attending: Family Medicine | Admitting: Family Medicine

## 2015-07-01 DIAGNOSIS — Z1231 Encounter for screening mammogram for malignant neoplasm of breast: Secondary | ICD-10-CM

## 2016-08-17 ENCOUNTER — Encounter (HOSPITAL_COMMUNITY): Payer: Self-pay | Admitting: Emergency Medicine

## 2016-08-17 ENCOUNTER — Emergency Department (HOSPITAL_COMMUNITY): Payer: 59

## 2016-08-17 ENCOUNTER — Emergency Department (HOSPITAL_COMMUNITY)
Admission: EM | Admit: 2016-08-17 | Discharge: 2016-08-18 | Disposition: A | Discharge status: Home / Self Care | Payer: 59 | Attending: Emergency Medicine | Admitting: Emergency Medicine

## 2016-08-17 DIAGNOSIS — E119 Type 2 diabetes mellitus without complications: Secondary | ICD-10-CM | POA: Insufficient documentation

## 2016-08-17 DIAGNOSIS — R51 Headache: Secondary | ICD-10-CM | POA: Diagnosis present

## 2016-08-17 DIAGNOSIS — R519 Headache, unspecified: Secondary | ICD-10-CM

## 2016-08-17 HISTORY — DX: Type 2 diabetes mellitus without complications: E11.9

## 2016-08-17 HISTORY — DX: Major depressive disorder, single episode, unspecified: F32.9

## 2016-08-17 HISTORY — DX: Anxiety disorder, unspecified: F41.9

## 2016-08-17 HISTORY — DX: Depression, unspecified: F32.A

## 2016-08-17 HISTORY — DX: Herpesviral infection, unspecified: B00.9

## 2016-08-17 LAB — CBG MONITORING, ED: Glucose-Capillary: 100 mg/dL — ABNORMAL HIGH (ref 65–99)

## 2016-08-17 LAB — URINALYSIS, ROUTINE W REFLEX MICROSCOPIC
Bilirubin Urine: NEGATIVE
GLUCOSE, UA: NEGATIVE mg/dL
Ketones, ur: NEGATIVE mg/dL
Leukocytes, UA: NEGATIVE
Nitrite: NEGATIVE
Protein, ur: NEGATIVE mg/dL
Specific Gravity, Urine: 1.021 (ref 1.005–1.030)
pH: 5.5 (ref 5.0–8.0)

## 2016-08-17 LAB — URINE MICROSCOPIC-ADD ON
Bacteria, UA: NONE SEEN
RBC / HPF: NONE SEEN RBC/hpf (ref 0–5)
Squamous Epithelial / LPF: NONE SEEN
WBC, UA: NONE SEEN WBC/hpf (ref 0–5)

## 2016-08-17 MED ORDER — ACETAMINOPHEN 500 MG PO TABS
1000.0000 mg | ORAL_TABLET | Freq: Once | ORAL | Status: AC
  Administered 2016-08-17: 1000 mg via ORAL
  Filled 2016-08-17: qty 2

## 2016-08-17 MED ORDER — LORAZEPAM 1 MG PO TABS
1.0000 mg | ORAL_TABLET | Freq: Once | ORAL | Status: AC
  Administered 2016-08-17: 1 mg via ORAL
  Filled 2016-08-17: qty 1

## 2016-08-17 NOTE — ED Triage Notes (Signed)
Pt comes with complaints of headaches, dry mouth, increased urinary frequency, and dizziness. Has been diagnosed as pre-diabetic and is worried that she may have transitioned into diabetes. A&O x4 and ambulatory in triage.

## 2016-08-17 NOTE — ED Provider Notes (Signed)
[facility]-EMERGENCY DEPT Provider Note   CSN: [id number] Arrival date & time: 08/17/16  1757     History   Chief Complaint Chief Complaint  Patient presents with  . Headache  . Dry Mouth  . Urinary Frequency  . Dizziness    HPI Gina Meyers is a 55 y.o. female.  HPI complains of occipital headache gradual onset nonradiating 1 PM today accompanied by intermittent diplopia, dry mouth and urinary frequency. She is concerned that her blood sugar may be high she was recently diagnosed with prediabetes. Other associated symptoms include increased thirst  urinary frequency and mild lightheadedness. Presently headache is moderate to severe. She denies diplopia present. Nothing makes symptoms better or worse. Has not had similar headaches before. No other associated symptoms  Past Medical History:  Diagnosis Date  . Anxiety   . Depression   . 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 Herpes   . Seasonal allergies     Patient Active Problem List   Diagnosis Date Noted  . Palpitations 12/30/2013  . Chest pain 12/30/2013    Past Surgical History:  Procedure Laterality Date  . CESAREAN SECTION    . CYST EXCISION    . TUBAL LIGATION      OB History    No data available       Home Medications    Prior to Admission medications   Medication Sig Start Date End Date Taking? Authorizing Provider  albuterol (PROVENTIL HFA;VENTOLIN HFA) 108 (90 BASE) MCG/ACT inhaler Inhale 1-2 puffs into the lungs every 6 (six) hours as needed for wheezing or shortness of breath.    Historical Provider, MD  bismuth subsalicylate (PEPTO BISMOL) 262 MG/15ML suspension Take 30 mLs by mouth every 6 (six) hours as needed for indigestion.    Historical Provider, MD  cephALEXin (KEFLEX) 500 MG capsule Take 1 capsule (500 mg total) by mouth 3 (three) times daily. Patient not taking: Reported on 08/17/2016 07/05/14   Terri Piedra, PA-C  estradiol (ESTRACE) 1 MG tablet Take 1 mg by  mouth daily.    Historical Provider, MD  fluticasone (FLONASE) 50 MCG/ACT nasal spray Place 2 sprays into both nostrils daily as needed for allergies or rhinitis.    Historical Provider, MD  ibuprofen (ADVIL,MOTRIN) 200 MG tablet Take 200 mg by mouth daily as needed for mild pain.    Historical Provider, MD  Pseudoeph-Doxylamine-DM-APAP (NYQUIL PO) Take 30 mLs by mouth at bedtime as needed (for cold symptoms).    Historical Provider, MD  Vitamin D, Ergocalciferol, (DRISDOL) 50000 UNITS CAPS capsule Take 50,000 Units by mouth every 7 (seven) days. Normally takes on Saturdays or early Sunday morning    Historical Provider, MD    Family History No family history on file.  Social History Social History  Substance Use Topics  . Smoking status: Never Smoker  . Smokeless tobacco: Never Used  . Alcohol use No     Allergies   Sulfa antibiotics   Review of Systems Review of Systems  Constitutional: Negative.   HENT: Negative.   Eyes: Positive for visual disturbance.  Respiratory: Negative.   Cardiovascular: Negative.   Gastrointestinal: Negative.   Endocrine: Positive for polydipsia and polyuria.  Musculoskeletal: Negative.   Skin: Negative.   Neurological: Positive for light-headedness and headaches.  Psychiatric/Behavioral: Negative.   All other systems reviewed and are negative.    Physical Exam Updated Vital Signs BP 129/89 (BP Location: Right Arm)   Pulse 78   Temp 98.5  F (36.9 C)   Resp 18   Ht 5\' 4"  (1.626 m)   Wt 186 lb (84.4 kg)   SpO2 100%   BMI 31.93 kg/m   Physical Exam  Constitutional: She is oriented to person, place, and time. She appears well-developed and well-nourished. No distress.  HENT:  Head: Normocephalic and atraumatic.  Eyes: Conjunctivae are normal. Pupils are equal, round, and reactive to light.  Fundi benign  Neck: Neck supple. No tracheal deviation present. No thyromegaly present.  Cardiovascular: Normal rate and regular rhythm.   No  murmur heard. Pulmonary/Chest: Effort normal and breath sounds normal.  Abdominal: Soft. Bowel sounds are normal. She exhibits no distension. There is no tenderness.  Musculoskeletal: Normal range of motion. She exhibits no edema or tenderness.  Neurological: She is alert and oriented to person, place, and time. She displays normal reflexes. Coordination normal.  Gait normal finger to nose normal Romberg normal pronator drift normal DTR symmetric bilaterally at knee jerk ankle jerk and biceps toes downward going bilaterally  Skin: Skin is warm and dry. No rash noted.  Psychiatric: She has a normal mood and affect.  Nursing note and vitals reviewed.    ED Treatments / Results  Labs (all labs ordered are listed, but only abnormal results are displayed) Labs Reviewed  URINALYSIS, ROUTINE W REFLEX MICROSCOPIC (NOT AT [facility]) - Abnormal; Notable for the following:       Result Value   Hgb urine dipstick TRACE (*)    All other components within normal limits  CBG MONITORING, ED - Abnormal; Notable for the following:    Glucose-Capillary 100 (*)    All other components within normal limits  URINE MICROSCOPIC-ADD ON    EKG  EKG Interpretation None       Radiology No results found.  Procedures Procedures (including critical care time)  Medications Ordered in ED Medications  acetaminophen (TYLENOL) tablet 1,000 mg (1,000 mg Oral Given 08/17/16 2137)  LORazepam (ATIVAN) tablet 1 mg (1 mg Oral Given 08/17/16 2150)    Results for orders placed or performed during the hospital encounter of 08/17/16  Urinalysis, Routine w reflex microscopic  Result Value Ref Range   Color, Urine YELLOW YELLOW   APPearance CLEAR CLEAR   Specific Gravity, Urine 1.021 1.005 - 1.030   pH 5.5 5.0 - 8.0   Glucose, UA NEGATIVE NEGATIVE mg/dL   Hgb urine dipstick TRACE (A) NEGATIVE   Bilirubin Urine NEGATIVE NEGATIVE   Ketones, ur NEGATIVE NEGATIVE mg/dL   Protein, ur NEGATIVE NEGATIVE mg/dL   Nitrite  NEGATIVE NEGATIVE   Leukocytes, UA NEGATIVE NEGATIVE  Urine microscopic-add on  Result Value Ref Range   Squamous Epithelial / LPF NONE SEEN NONE SEEN   WBC, UA NONE SEEN 0 - 5 WBC/hpf   RBC / HPF NONE SEEN 0 - 5 RBC/hpf   Bacteria, UA NONE SEEN NONE SEEN  CBG monitoring, ED  Result Value Ref Range   Glucose-Capillary 100 (H) 65 - 99 mg/dL   No results found. Initial Impression / Assessment and Plan / ED Course  I have reviewed the triage vital signs and the nursing notes.  Pertinent labs & imaging results that were available during my care of the patient were reviewed by me and considered in my medical decision making (see chart for details).  Clinical Course      was premedicated with Ativan by mouth however she could not cooperate with MRI scan due to claustrophobia. She elected not to have an MRI  tonight. MRI was ordered to check for posterior circulation stroke. 15 resolved diplopia she exhibits TIA symptoms. Her headache is much improved after treatment with Tylenol She prefers referral to neurologist as outpatient. She'll be given aspirin 325 mg prior to discharge and suggest aspirin 325 mg daily. [facility] to [facility]  Final Clinical Impressions(s) / ED Diagnoses  Dx Headache Final diagnoses:  None    New Prescriptions New Prescriptions   No medications on file     Doug SouSam Emilianna Barlowe, MD 08/18/16 0040

## 2016-08-17 NOTE — ED Notes (Signed)
Patient transported to MRI 

## 2016-08-17 NOTE — ED Notes (Signed)
MRI called and stated patient wanted "nothing to do with the MRI" even after medication to help soothe her.  Patient will be brought back to speak to doctor.

## 2016-08-18 MED ORDER — ASPIRIN EC 325 MG PO TBEC
325.0000 mg | DELAYED_RELEASE_TABLET | Freq: Once | ORAL | Status: AC
  Administered 2016-08-18: 325 mg via ORAL
  Filled 2016-08-18: qty 1

## 2016-08-18 NOTE — ED Notes (Signed)
Pt requested to leave.  Let MD know.

## 2016-08-18 NOTE — Discharge Instructions (Signed)
Take coated aspirin 325 milligrams daily.  Call [place] [facility] or [facility] tomorrow to schedule next available appointment. Return if your condition worsens for any reason

## 2016-08-25 ENCOUNTER — Encounter: Payer: Self-pay | Admitting: Neurology

## 2016-08-25 ENCOUNTER — Ambulatory Visit (INDEPENDENT_AMBULATORY_CARE_PROVIDER_SITE_OTHER): Payer: 59 | Admitting: Neurology

## 2016-08-25 VITALS — BP 118/70 | HR 68 | Resp 20 | Ht 64.0 in | Wt 189.0 lb

## 2016-08-25 DIAGNOSIS — H532 Diplopia: Secondary | ICD-10-CM | POA: Diagnosis not present

## 2016-08-25 DIAGNOSIS — F064 Anxiety disorder due to known physiological condition: Secondary | ICD-10-CM | POA: Diagnosis not present

## 2016-08-25 DIAGNOSIS — M542 Cervicalgia: Secondary | ICD-10-CM

## 2016-08-25 DIAGNOSIS — M5481 Occipital neuralgia: Secondary | ICD-10-CM

## 2016-08-25 DIAGNOSIS — E0829 Diabetes mellitus due to underlying condition with other diabetic kidney complication: Secondary | ICD-10-CM

## 2016-08-25 DIAGNOSIS — G44209 Tension-type headache, unspecified, not intractable: Secondary | ICD-10-CM | POA: Diagnosis not present

## 2016-08-25 MED ORDER — ESCITALOPRAM OXALATE 20 MG PO TABS: 20.0000 mg | ORAL_TABLET | Freq: Every day | ORAL | 5 refills | Status: DC

## 2016-08-25 MED ORDER — ALPRAZOLAM 1 MG PO TABS: 1.0000 mg | ORAL_TABLET | Freq: Every evening | ORAL | 0 refills | Status: DC | PRN

## 2016-08-25 NOTE — Patient Instructions (Signed)
Diplopia °Introduction °Diplopia is the condition of having double vision or seeing two of a single object. There are many causes of diplopia. Some are not dangerous and can be easily corrected. Diplopia may also be a symptom of a serious medical problem. °There are two types of diplopia. °· Monocular diplopia. This is double vision that affects only one eye. Monocular diplopia is often caused by a clouding of the lens in your eye (cataract) or by disruptions in the way that your eye focuses light. °· Binocular diplopia. This is double vision that affects both eyes. However, when you shut one eye, the double vision will go away. Binocular diplopia may be more serious. It can be caused by: °¨ Problems with the nerves or muscles that are responsible for eye movement. °¨ Neurologic diseases. °¨ Thyroid problems. °¨ Tumors. °¨ An infection near your eyes. °¨ A stroke. °You may need to see a health care provider who specializes in eye conditions (ophthalmologist) or a nerve specialist (neurologist) to find the cause. °Follow these instructions at home: °· Tell your health care provider about any changes in your vision. °· Do not drive or operate heavy machinery if diplopia interferes with your vision. °· Keep all follow-up visits as directed by your health care provider. This is important. °Contact a health care provider if: °· Your diplopia gets worse. °· You develop any other symptoms along with your diplopia, such as: °¨ Weakness. °¨ Numbness. °¨ Headache. °¨ Eye pain. °¨ Clumsiness. °¨ Nausea. °¨ Drooping eyelids. °¨ Abnormal movement of one of your eyes. °Get help right away if: °· You have sudden vision loss. °· You suddenly get a very bad headache. °· You have sudden weakness or numbness. °· You suddenly lose the ability to speak, understand speech, or both. °This information is not intended to replace advice given to you by your health care provider. Make sure you discuss any questions you have with your health  care provider. °Document Released: 07/16/2004 Document Revised: 02/20/2016 Document Reviewed: 08/08/2014 °© 2017 [employer] ° °

## 2016-08-25 NOTE — Progress Notes (Signed)
Provider:  Melvyn Novasarmen  Shanteria Laye, M D  Referring Provider: No ref. provider found Primary Care Physician:  ED referral, PCP Dr.  Leavy CellaBoyd  Chief Complaint  Patient presents with  . New Patient (Initial Visit)    headaches    HPI:  Gina Meyers is a 55 y.o. female , seen here as a referral  from Dr. ED,  Gina Meyers [facility] where she was under the care of Dr. Remi HaggardSam Jacobowitz.  She presented with an occipital headache that she described of gradual onset, not radiating to other parts of the body, and associated by intermittent diplopia, double vision.  The images were skewed on top of each other, not horizontal and not strictly vertical. She also reported a very dry mouth and urinary frequency. She was diagnosed in the past was pre-diabetes and worried that this could be a diabetic manifestation. She felt thirsty or. At the time she presented to the emergency room her headache was moderate to severe and no diplopia was present. She was concerned that she never had the same kind of headache before. Dr. Rennis ChrisJacobowitz gave the patient Tylenol and her headaches became better, she still however has headaches now more than 5 days after the visits to the emergency room. She was also given lorazepam in preparation for an MRI brain but could not overcome her claustrophobia with this medication alone. The MRI was not done in the outpatient neurology clinic was asked to perform it. She was told to take an aspirin daily in case her diplopia was a manifestation of a TIA. Laboratory tests were performed including a urine test which was entirely negative, her capillary blood glucose was 100 mg/dl so just a tad bit elevated,  She reports her primary care physician is Dr. Leavy [facility]ellaBoyd [facility] [address], family practice.  She carries a diagnosis of anxiety and depression, diabetes mellitus without complication, seasonal allergies with rhinitis, and a history of from his  labialis. She also reported palpitations and chest pain in 2015, she has never been hospitalized for a prolonged period of time, has never experienced any trauma or motor vehicle accident requiring admission. She is taking Pepto-Bismol, albuterol, Keflex, Estrace, Flonase, ibuprofen vitamin D and when necessary over-the-counter NyQuil.  She is not a smoker does not drink alcohol and uses caffeine in form of  Dr Reino KentPepper , 24 ounces 4 times a day.  After her emergency room visit she cut down on her caffeine and soda intake. She works in [employer].    Review of Systems: Out of a complete 14 system review, the patient complains of only the following symptoms, and all other reviewed systems are negative.    depression score 8-15   Social History   Social History  . Marital status: Married    Spouse name: N/A  . Number of children: N/A  . Years of education: N/A   Occupational History  . Not on file.   Social History Main Topics  . Smoking status: Never Smoker  . Smokeless tobacco: Never Used  . Alcohol use No  . Drug use: No  . Sexual activity: Not on file   Other Topics Concern  . Not on file   Social History Narrative  . No narrative on file    No family history on file.  Past Medical History:  Diagnosis Date  . Anxiety   . Depression   . Diabetes mellitus without complication (HCC)  pre diabetic  . Herpes   . Seasonal allergies     Past Surgical History:  Procedure Laterality Date  . CESAREAN SECTION    . CYST EXCISION    . TUBAL LIGATION      Current Outpatient Prescriptions  Medication Sig Dispense Refill  . albuterol (PROVENTIL HFA;VENTOLIN HFA) 108 (90 BASE) MCG/ACT inhaler Inhale 1-2 puffs into the lungs every 6 (six) hours as needed for wheezing or shortness of breath.    . bismuth subsalicylate (PEPTO BISMOL) 262 MG/15ML suspension Take 30 mLs by mouth every 6 (six) hours as needed for indigestion.    .  cephALEXin (KEFLEX) 500 MG capsule Take 1 capsule (500 mg total) by mouth 3 (three) times daily. 21 capsule 0  . escitalopram (LEXAPRO) 20 MG tablet Take 20 mg by mouth daily.    Marland Kitchen. estradiol (ESTRACE) 1 MG tablet Take 1 mg by mouth daily.     No current facility-administered medications for this visit.     Allergies as of 08/25/2016 - Review Complete 08/25/2016  Allergen Reaction Noted  . Sulfa antibiotics Hives 12/29/2013    Vitals: BP 118/70   Pulse 68   Resp 20   Ht 5\' 4"  (1.626 m)   Wt 189 lb (85.7 kg)   BMI 32.44 kg/m  Last Weight:  Wt Readings from Last 1 Encounters:  08/25/16 189 lb (85.7 kg)   ZOX:WRUEBMI:Body mass index is 32.44 kg/m.     Last Height:   Ht Readings from Last 1 Encounters:  08/25/16 5\' 4"  (1.626 m)    Physical exam:  General: The patient is awake, alert and appears not in acute distress. The patient is well groomed. Head: Normocephalic, atraumatic. Neck is supple. Cardiovascular:  Regular rate and rhythm , without  murmurs or carotid bruit, and without distended neck veins. Respiratory: Lungs are clear to auscultation. Skin:  Without evidence of edema, or rash  Neurologic exam : The patient is awake and alert, oriented to place and time.   Attention span & concentration ability appears normal.  Speech is fluent,  Without dysarthria, dysphonia or aphasia.  Mood and affect are appropriate.  Cranial nerves: Pupils are equal and briskly reactive to light. Funduscopic exam without evidence of pallor or edema.  Extraocular movements  in vertical and horizontal planes intact and without nystagmus.  Visual fields by finger perimetry are intact. Hearing to finger rub intact.   Facial sensation intact to fine touch.  Facial motor strength is symmetric and tongue and uvula move midline. Shoulder shrug was symmetrical.  The patient had significant tension over the paraspinal cervical area, shoulder area and at the occipital notch.  Motor exam: Normal tone,  muscle bulk and symmetric strength in all extremities.  Sensory:  Fine touch, pinprick and vibration were tested in all extremities. Proprioception tested in the upper extremities was normal.  Coordination: Rapid alternating movements in the fingers/hands was normal. Finger-to-nose maneuver  normal without evidence of ataxia, dysmetria or tremor.  Gait and station: Patient walks without assistive device and is able unassisted to climb up to the exam table. Strength within normal limits.  Stance is stable and normal.  Deep tendon reflexes: in the  upper and lower extremities are symmetric and intact.  The patient was advised of the nature of the diagnosed sleep disorder , the treatment options and risks for general a health and wellness arising from not treating the condition.   I spent more than 45 minutes of face to face time with  the patient. Greater than 50% of time was spent in counseling and coordination of care. We have discussed the diagnosis and differential and I answered the patient's questions.     Assessment:  After physical and neurologic examination, review of laboratory studies,  Personal review of imaging studies, reports of other /same  Imaging studies ,  Results of polysomnography/ neurophysiology testing and pre-existing records as far as provided in visit., my assessment is   1) intermittent diplopia associated with severe headache warrants a further workup for possible TIA or stroke. The patient has not been left with residuals but still has a headache, all bite less intense. I will order an MRI with MRA for her. In addition I think she has significant tension headaches and she is currently in a socially precarious situation, but recently separated has to start a new job has moved to a new city. I will be happy to refill her Lexapro generic prescription that she can fill at [facility]. I think this will help her with anxiety, panic and depression. She will follow up with either me  or one of our nurse practitioners in the next 4 weeks, I goal is to rule out cerebrovascular disease first. She does need to be established with a new primary care physician to make sure that her diabetes remains borderline and is not part of an oculomotor deficit.   Plan:  Treatment plan and additional workup :  The patient will undergo an MRI/MRA, I will refer her to the hospital for possible sedation as she is extremely claustrophobic. I will refill her citalopram, hopefully she will get better sleep more rest more relaxation. She may consider a massage treatment. Tension headaches play a big role in occipital neuralgia and cervicalgia. Rv in 4 weeks.      Porfirio Mylar Alania Overholt MD  08/25/2016   CC: No referring provider defined for this encounter.

## 2016-08-31 ENCOUNTER — Telehealth: Payer: Self-pay | Admitting: Neurology

## 2016-08-31 NOTE — Telephone Encounter (Signed)
Pt called wanting to know when to take xanax prior to MRI. Can leave detailed msg on VM

## 2016-08-31 NOTE — Telephone Encounter (Signed)
LM with MRI xanax protocol. Left call back number if any further questions.

## 2016-09-05 ENCOUNTER — Other Ambulatory Visit: Payer: 59

## 2016-09-30 ENCOUNTER — Ambulatory Visit
Admission: RE | Admit: 2016-09-30 | Discharge: 2016-09-30 | Disposition: A | Discharge status: Home / Self Care | Payer: 59 | Source: Ambulatory Visit | Attending: Neurology | Admitting: Neurology

## 2016-09-30 ENCOUNTER — Other Ambulatory Visit: Payer: Self-pay

## 2016-09-30 DIAGNOSIS — H532 Diplopia: Secondary | ICD-10-CM

## 2016-09-30 DIAGNOSIS — M542 Cervicalgia: Secondary | ICD-10-CM

## 2016-09-30 DIAGNOSIS — M5481 Occipital neuralgia: Secondary | ICD-10-CM

## 2016-09-30 DIAGNOSIS — E0829 Diabetes mellitus due to underlying condition with other diabetic kidney complication: Secondary | ICD-10-CM

## 2016-09-30 DIAGNOSIS — G44209 Tension-type headache, unspecified, not intractable: Secondary | ICD-10-CM

## 2016-09-30 MED ORDER — ALPRAZOLAM 0.5 MG PO TABS: 0.5000 mg | ORAL_TABLET | Freq: Two times a day (BID) | ORAL | 0 refills | Status: DC | PRN

## 2016-09-30 MED ORDER — GADOBENATE DIMEGLUMINE 529 MG/ML IV SOLN
20.0000 mL | Freq: Once | INTRAVENOUS | Status: AC | PRN
  Administered 2016-09-30: 17 mL via INTRAVENOUS

## 2016-09-30 NOTE — Telephone Encounter (Signed)
Prescription fax to [phone].[facility] [address]

## 2016-09-30 NOTE — Telephone Encounter (Signed)
Pt called said she was to have MRI 12/9 but due to weather it was r/s to today. She did take 1 xanax that day. She is calling today to get RX for #1 tab, she is having MRI tonight. Please call to [facility]

## 2016-09-30 NOTE — Telephone Encounter (Signed)
Prescription done for xanax for 2 pills for MRI testing. Rn waiting on signature from Dr. Pearlean BrownieSethi who is the work in [place].

## 2016-10-06 ENCOUNTER — Telehealth: Payer: Self-pay

## 2016-10-06 NOTE — Telephone Encounter (Signed)
-----   Message from Melvyn Novasarmen Dohmeier, MD sent at 10/05/2016  5:19 PM EST ----- Normal appearance of larger brain vessels. No aneurysm or stenosis. CD

## 2016-10-06 NOTE — Telephone Encounter (Signed)
I called pt. I advised her that her MRI/MRA results per Dr. Vickey Hugerohmeier were normal. Pt will be attending her 10/08/16 follow up with Dr. Vickey Hugerohmeier. Pt verbalized understanding of results. Pt had no questions at this time but was encouraged to call back if questions arise.

## 2016-10-06 NOTE — Telephone Encounter (Signed)
I called pt to discuss MRI/MRA results. No answer, left a message asking pt to call me back.

## 2016-10-08 ENCOUNTER — Encounter: Payer: Self-pay | Admitting: Neurology

## 2016-10-08 ENCOUNTER — Ambulatory Visit (INDEPENDENT_AMBULATORY_CARE_PROVIDER_SITE_OTHER): Payer: 59 | Admitting: Neurology

## 2016-10-08 VITALS — BP 128/81 | HR 75 | Resp 20 | Ht 64.5 in | Wt 193.0 lb

## 2016-10-08 DIAGNOSIS — H532 Diplopia: Secondary | ICD-10-CM

## 2016-10-08 DIAGNOSIS — G44209 Tension-type headache, unspecified, not intractable: Secondary | ICD-10-CM | POA: Diagnosis not present

## 2016-10-08 DIAGNOSIS — R41840 Attention and concentration deficit: Secondary | ICD-10-CM

## 2016-10-08 MED ORDER — ALPRAZOLAM 0.5 MG PO TABS: 0.5000 mg | ORAL_TABLET | Freq: Every evening | ORAL | 0 refills | Status: DC | PRN

## 2016-10-08 NOTE — Patient Instructions (Signed)
Generalized Anxiety Disorder Generalized anxiety disorder (GAD) is a mental disorder. It interferes with life functions, including relationships, work, and school. GAD is different from normal anxiety, which everyone experiences at some point in their lives in response to specific life events and activities. Normal anxiety actually helps us prepare for and get through these life events and activities. Normal anxiety goes away after the event or activity is over.  GAD causes anxiety that is not necessarily related to specific events or activities. It also causes excess anxiety in proportion to specific events or activities. The anxiety associated with GAD is also difficult to control. GAD can vary from mild to severe. People with severe GAD can have intense waves of anxiety with physical symptoms (panic attacks).  SYMPTOMS The anxiety and worry associated with GAD are difficult to control. This anxiety and worry are related to many life events and activities and also occur more days than not for 6 months or longer. People with GAD also have three or more of the following symptoms (one or more in children):  Restlessness.   Fatigue.  Difficulty concentrating.   Irritability.  Muscle tension.  Difficulty sleeping or unsatisfying sleep. DIAGNOSIS GAD is diagnosed through an assessment by your health care provider. Your health care provider will ask you questions aboutyour mood,physical symptoms, and events in your life. Your health care provider may ask you about your medical history and use of alcohol or drugs, including prescription medicines. Your health care provider may also do a physical exam and blood tests. Certain medical conditions and the use of certain substances can cause symptoms similar to those associated with GAD. Your health care provider may refer you to a mental health specialist for further evaluation. TREATMENT The following therapies are usually used to treat GAD:    Medication. Antidepressant medication usually is prescribed for long-term daily control. Antianxiety medicines may be added in severe cases, especially when panic attacks occur.   Talk therapy (psychotherapy). Certain types of talk therapy can be helpful in treating GAD by providing support, education, and guidance. A form of talk therapy called cognitive behavioral therapy can teach you healthy ways to think about and react to daily life events and activities.  Stress managementtechniques. These include yoga, meditation, and exercise and can be very helpful when they are practiced regularly. A mental health specialist can help determine which treatment is best for you. Some people see improvement with one therapy. However, other people require a combination of therapies. This information is not intended to replace advice given to you by your health care provider. Make sure you discuss any questions you have with your health care provider. Document Released: 01/09/2013 Document Revised: 10/05/2014 Document Reviewed: 01/09/2013 Elsevier Interactive Patient Education  2017 Elsevier Inc.  

## 2016-10-08 NOTE — Progress Notes (Signed)
Provider:  Larey Seat, M D  Referring Provider: No ref. provider found Primary Care Physician:  ED referral, PCP Dr.  Luciana Meyers  Chief Complaint  Patient presents with  . Follow-up    mri mra results, vision 'ok'    HPI:  Gina Meyers is a 56 y.o. female , seen here as a referral  from Dr. ED,  Gina Meyers presented recently through the [facility] emergency department where she was under the care of Dr. Hoover Brunette.  She presented with an occipital headache that she described of gradual onset, not radiating to other parts of the body, and associated by intermittent diplopia, double vision.  The images were skewed on top of each other, not horizontal and not strictly vertical. She also reported a very dry mouth and urinary frequency. She was diagnosed in the past was pre-diabetes and worried that this could be a diabetic manifestation. She felt thirsty or. At the time she presented to the emergency room her headache was moderate to severe and no diplopia was present. She was concerned that she never had the same kind of headache before. Dr. Cathleen Fears gave the patient Tylenol and her headaches became better, she still however has headaches now more than 5 days after the visits to the emergency room. She was also given lorazepam in preparation for an MRI brain but could not overcome her claustrophobia with this medication alone. The MRI was not done in the outpatient neurology clinic was asked to perform it. She was told to take an aspirin daily in case her diplopia was a manifestation of a TIA. Laboratory tests were performed including a urine test which was entirely negative, her capillary blood glucose was 100 mg/dl so just a tad bit elevated,  She reports her primary care physician is Dr. Luciana Meyers on [facility], family practice.  She carries a diagnosis of anxiety and depression, diabetes mellitus without complication, seasonal allergies with rhinitis, and a history of from his  labialis. She also reported palpitations and chest pain in 2015, she has never been hospitalized for a prolonged period of time, has never experienced any trauma or motor vehicle accident requiring admission. She is taking Pepto-Bismol, albuterol, Keflex, Estrace, Flonase, ibuprofen vitamin D and when necessary over-the-counter NyQuil.  She is not a smoker does not drink alcohol and uses caffeine in form of  Dr Malachi Bonds , 24 ounces 4 times a day.  After her emergency room visit she cut down on her caffeine and soda intake. She works in [employer].  Interval history from 10/08/2016. Gina Meyers has done excellently since we last met she has not had further diplopia but sometimes tension headaches. But she was out over the holidays she realized that her work situation has a lot to do with her physical symptoms and her anxiety and stress level is the cause of many health problems. She has worked on finding a new primary care physician, I will also recommend that she sees either a counselor or psychologist to see if we can find nonmedical ways to help her with anxiety and stress management. She did receive one prescription from me for Xanax but I have dicided that she doesn't take it more than 3 times a week. I will inquire if I were social services department can help finding a counseling service that will be affordable to the patient. In addition I have reported that her MRI of the brain was unremarkable, and there was  no abnormality in her vasculature. She does not show signs of myasthenia. The diplopia and tension headaches have not recurred.   Review of Systems: Out of a complete 14 system review, the patient complains of only the following symptoms, and all other reviewed systems are negative.    depression score 8-15 ,   Social History   Social History  . Marital status: Married    Spouse name: N/A  . Number of children: N/A  . Years of education: N/A    Occupational History  . Not on file.   Social History Main Topics  . Smoking status: Never Smoker  . Smokeless tobacco: Never Used  . Alcohol use No  . Drug use: No  . Sexual activity: Not on file   Other Topics Concern  . Not on file   Social History Narrative  . No narrative on file    No family history on file.  Past Medical History:  Diagnosis Date  . Anxiety   . Depression   . Diabetes mellitus without complication ([place])    pre diabetic  . Herpes   . Seasonal allergies     Past Surgical History:  Procedure Laterality Date  . CESAREAN SECTION    . CYST EXCISION    . TUBAL LIGATION      Current Outpatient Prescriptions  Medication Sig Dispense Refill  . albuterol (PROVENTIL HFA;VENTOLIN HFA) 108 (90 BASE) MCG/ACT inhaler Inhale 1-2 puffs into the lungs every 6 (six) hours as needed for wheezing or shortness of breath.    . ALPRAZolam (XANAX) 0.5 MG tablet Take 1 tablet (0.5 mg total) by mouth 2 (two) times daily as needed for anxiety. Take one tablet prior to MRI testing, and one during testing. PLEASE DONT DRIVE WHILE TAKING THIS MEDICATION. PLEASE HAVE A DRIVER TO AND FROM THE TEST. 2 tablet 0  . ALPRAZolam (XANAX) 1 MG tablet Take 1 tablet (1 mg total) by mouth at bedtime as needed for anxiety. 2 tablet 0  . bismuth subsalicylate (PEPTO BISMOL) 262 MG/15ML suspension Take 30 mLs by mouth every 6 (six) hours as needed for indigestion.    . cephALEXin (KEFLEX) 500 MG capsule Take 1 capsule (500 mg total) by mouth 3 (three) times daily. 21 capsule 0  . escitalopram (LEXAPRO) 20 MG tablet Take 1 tablet (20 mg total) by mouth daily. 30 tablet 5  . estradiol (ESTRACE) 1 MG tablet Take 1 mg by mouth daily.     No current facility-administered medications for this visit.     Allergies as of 10/08/2016 - Review Complete 10/08/2016  Allergen Reaction Noted  . Sulfa antibiotics Hives 12/29/2013    Vitals: BP 128/81   Pulse 75   Resp 20   Ht 5' 4.5" (1.638 m)    Wt 193 lb (87.5 kg)   BMI 32.62 kg/m  Last Weight:  Wt Readings from Last 1 Encounters:  10/08/16 193 lb (87.5 kg)   OIZ:TIWP mass index is 32.62 kg/m.     Last Height:   Ht Readings from Last 1 Encounters:  10/08/16 5' 4.5" (1.638 m)    Physical exam:  General: The patient is awake, alert and appears not in acute distress. The patient is well groomed. Head: Normocephalic, atraumatic. Neck is supple. Cardiovascular:  Regular rate and rhythm , without  murmurs or carotid bruit, and without distended neck veins. Respiratory: Lungs are clear to auscultation. Skin:  Without evidence of edema, or rash  Neurologic exam : The patient is awake  and alert, oriented to place and time.   Attention span & concentration ability appears normal.  Speech is fluent,  Without dysarthria, dysphonia or aphasia.  Mood and affect are appropriate.  Cranial nerves: Pupils are equal and briskly reactive to light. Funduscopic exam without evidence of pallor or edema.  Extraocular movements  in vertical and horizontal planes intact and without nystagmus.  Visual fields by finger perimetry are intact. Hearing to finger rub intact.   Facial sensation intact to fine touch.  Facial motor strength is symmetric and tongue and uvula move midline. Shoulder shrug was symmetrical.  The patient had significant tension over the paraspinal cervical area, shoulder area and at the occipital notch.  Motor exam: Normal tone, muscle bulk and symmetric strength in all extremities. Sensory:  Fine touch, pinprick and vibration were tested in all extremities. Proprioception tested in the upper extremities was normal. Coordination: Rapid alternating movements in the fingers/hands was normal. Finger-to-nose maneuver  normal without evidence of ataxia, dysmetria or tremor.  Gait and station: Patient walks without assistive device and is able unassisted to climb up to the exam table. Strength within normal limits.  Stance is  stable and normal.  Deep tendon reflexes: in the  upper and lower extremities are symmetric and intact.  I spent more than 25 minutes of face to face time with the patient. Greater than 50% of time was spent in counseling and coordination of care. We have discussed the diagnosis and differential and I answered the patient's questions.     Assessment:  After physical and neurologic examination, review of laboratory studies,  Personal review of imaging studies, reports of other /same  Imaging studies ,  Results of polysomnography/ neurophysiology testing and pre-existing records as far as provided in visit., my assessment is   1) intermittent diplopia associated with severe headache warrants a further workup for possible TIA or stroke. She experienced no further diplopia since last visit.  MRI was negative.  She attributes the diplopia and tension headaches to stress. She slept better with Xanax- she didn't needed in preparation for the MRI and instead used it in the late afternoon when coming home from work this helped her anxiety and tension. Xanax is highly addictive but is not taken on a daily basis very effective in alleviating anxiety and panic attacks. It also helps with vertigo.  I will order a low dose xanax for those days when she feels her stress is getting the better of her.  I recommend anxiety evaluation with a counselor or psychologist.    Asencion Partridge Rashawn Rayman MD  10/08/2016   CC: PCP

## 2016-11-04 ENCOUNTER — Ambulatory Visit: Payer: 59 | Admitting: Family Medicine

## 2016-12-30 DIAGNOSIS — E559 Vitamin D deficiency, unspecified: Secondary | ICD-10-CM | POA: Insufficient documentation

## 2016-12-30 DIAGNOSIS — R200 Anesthesia of skin: Secondary | ICD-10-CM | POA: Insufficient documentation

## 2016-12-30 DIAGNOSIS — R601 Generalized edema: Secondary | ICD-10-CM | POA: Insufficient documentation

## 2017-03-18 ENCOUNTER — Other Ambulatory Visit: Payer: Self-pay | Admitting: Obstetrics & Gynecology

## 2017-03-18 ENCOUNTER — Ambulatory Visit
Admission: RE | Admit: 2017-03-18 | Discharge: 2017-03-18 | Disposition: A | Discharge status: Home / Self Care | Payer: 59 | Source: Ambulatory Visit | Attending: Obstetrics & Gynecology | Admitting: Obstetrics & Gynecology

## 2017-03-18 DIAGNOSIS — M859 Disorder of bone density and structure, unspecified: Secondary | ICD-10-CM

## 2017-03-30 ENCOUNTER — Other Ambulatory Visit: Payer: 59

## 2017-03-30 ENCOUNTER — Other Ambulatory Visit: Payer: Self-pay | Admitting: Obstetrics & Gynecology

## 2017-03-30 ENCOUNTER — Other Ambulatory Visit (HOSPITAL_COMMUNITY): Payer: Self-pay | Admitting: Obstetrics & Gynecology

## 2017-03-30 DIAGNOSIS — M899 Disorder of bone, unspecified: Secondary | ICD-10-CM

## 2017-04-06 ENCOUNTER — Encounter (HOSPITAL_COMMUNITY)
Admission: RE | Admit: 2017-04-06 | Discharge: 2017-04-06 | Disposition: A | Discharge status: Home / Self Care | Payer: 59 | Source: Ambulatory Visit | Attending: Obstetrics & Gynecology | Admitting: Obstetrics & Gynecology

## 2017-04-06 DIAGNOSIS — M898X5 Other specified disorders of bone, thigh: Secondary | ICD-10-CM | POA: Insufficient documentation

## 2017-04-06 DIAGNOSIS — M899 Disorder of bone, unspecified: Secondary | ICD-10-CM

## 2017-04-06 MED ORDER — TECHNETIUM TC 99M MEDRONATE IV KIT
25.0000 | PACK | Freq: Once | INTRAVENOUS | Status: AC | PRN
  Administered 2017-04-06: 20.4 via INTRAVENOUS

## 2017-06-08 DIAGNOSIS — G8929 Other chronic pain: Secondary | ICD-10-CM | POA: Insufficient documentation

## 2017-06-08 DIAGNOSIS — N951 Menopausal and female climacteric states: Secondary | ICD-10-CM | POA: Insufficient documentation

## 2017-06-08 DIAGNOSIS — G4709 Other insomnia: Secondary | ICD-10-CM | POA: Insufficient documentation

## 2017-06-13 DIAGNOSIS — M791 Myalgia, unspecified site: Secondary | ICD-10-CM | POA: Insufficient documentation

## 2017-11-18 ENCOUNTER — Other Ambulatory Visit: Payer: Self-pay

## 2017-11-18 ENCOUNTER — Ambulatory Visit (HOSPITAL_COMMUNITY)
Admission: EM | Admit: 2017-11-18 | Discharge: 2017-11-18 | Disposition: A | Discharge status: Home / Self Care | Payer: BLUE CROSS/BLUE SHIELD | Attending: Family Medicine | Admitting: Family Medicine

## 2017-11-18 ENCOUNTER — Encounter (HOSPITAL_COMMUNITY): Payer: Self-pay | Admitting: Family Medicine

## 2017-11-18 DIAGNOSIS — J111 Influenza due to unidentified influenza virus with other respiratory manifestations: Secondary | ICD-10-CM

## 2017-11-18 DIAGNOSIS — R69 Illness, unspecified: Secondary | ICD-10-CM | POA: Diagnosis not present

## 2017-11-18 MED ORDER — OSELTAMIVIR PHOSPHATE 75 MG PO CAPS: 75.0000 mg | ORAL_CAPSULE | Freq: Two times a day (BID) | ORAL | 0 refills | Status: DC

## 2017-11-18 MED ORDER — ACETAMINOPHEN 325 MG PO TABS
650.0000 mg | ORAL_TABLET | Freq: Once | ORAL | Status: AC
  Administered 2017-11-18: 650 mg via ORAL

## 2017-11-18 MED ORDER — HYDROCODONE-HOMATROPINE 5-1.5 MG/5ML PO SYRP: 5.0000 mL | ORAL_SOLUTION | Freq: Four times a day (QID) | ORAL | 0 refills | Status: DC | PRN

## 2017-11-18 MED ORDER — ACETAMINOPHEN 325 MG PO TABS
ORAL_TABLET | ORAL | Status: AC
Start: 2017-11-18 — End: 2017-11-18
  Filled 2017-11-18: qty 2

## 2017-11-18 NOTE — ED Provider Notes (Signed)
[facility]   [id number] 11/18/17 Arrival Time: 1605   SUBJECTIVE:  Gina Meyers is a 57 y.o. female who presents to the urgent care with complaint of fever and cough x 3 days.  She really developed the fever and violent cough in the past 24 hours.  Works as a Conservation officer, naturecashier at [employer]  Past Medical History:  Diagnosis Date  . Anxiety   . Depression   . 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 Herpes   . Seasonal allergies    History reviewed. No pertinent family history. Social History   Socioeconomic History  . Marital status: Legally Separated    Spouse name: Not on file  . Number of children: Not on file  . Years of education: Not on file  . Highest education level: Not on file  Social Needs  . Financial resource strain: Not on file  . Food insecurity - worry: Not on file  . Food insecurity - inability: Not on file  . Transportation needs - medical: Not on file  . Transportation needs - non-medical: Not on file  Occupational History  . Not on file  Tobacco Use  . Smoking status: Never Smoker  . Smokeless tobacco: Never Used  Substance and Sexual Activity  . Alcohol use: No  . Drug use: No  . Sexual activity: Not on file  Other Topics Concern  . Not on file  Social History Narrative  . Not on file   Current Meds  Medication Sig  . ALPRAZolam (XANAX) 0.5 MG tablet Take 1 tablet (0.5 mg total) by mouth 2 (two) times daily as needed for anxiety. Take one tablet prior to MRI testing, and one during testing. PLEASE DONT DRIVE WHILE TAKING THIS MEDICATION. PLEASE HAVE A DRIVER TO AND FROM THE TEST.  Marland Kitchen. escitalopram (LEXAPRO) 20 MG tablet Take 1 tablet (20 mg total) by mouth daily.   Allergies  Allergen Reactions  . Sulfa Antibiotics Hives      ROS: As per HPI, remainder of ROS negative.   OBJECTIVE:   Vitals:   11/18/17 1627  BP: 104/78  Pulse: (!) 108  Temp: (!) 102 F (38.9 C)  TempSrc: Oral  SpO2: 95%      General appearance: alert; no distress Eyes: PERRL; EOMI; conjunctiva normal HENT: normocephalic; atraumatic; TMs normal, canal normal, external ears normal without trauma; nasal mucosa normal; oral mucosa normal Neck: supple Lungs: clear to auscultation bilaterally Heart: regular rate and rhythm Abdomen: soft, non-tender; Back: no CVA tenderness Extremities: no cyanosis or edema; symmetrical with no gross deformities Skin: warm and dry Neurologic: normal gait; grossly normal Psychological: alert and cooperative; normal mood and affect      Labs:  Results for orders placed or performed during the hospital encounter of 08/17/16  Urinalysis, Routine w reflex microscopic  Result Value Ref Range   Color, Urine YELLOW YELLOW   APPearance CLEAR CLEAR   Specific Gravity, Urine 1.021 1.005 - 1.030   pH 5.5 5.0 - 8.0   Glucose, UA NEGATIVE NEGATIVE mg/dL   Hgb urine dipstick TRACE (A) NEGATIVE   Bilirubin Urine NEGATIVE NEGATIVE   Ketones, ur NEGATIVE NEGATIVE mg/dL   Protein, ur NEGATIVE NEGATIVE mg/dL   Nitrite NEGATIVE NEGATIVE   Leukocytes, UA NEGATIVE NEGATIVE  Urine microscopic-add on  Result Value Ref Range   Squamous Epithelial / LPF NONE SEEN NONE SEEN   WBC, UA NONE SEEN 0 - 5 WBC/hpf   RBC / HPF NONE  SEEN 0 - 5 RBC/hpf   Bacteria, UA NONE SEEN NONE SEEN  CBG monitoring, ED  Result Value Ref Range   Glucose-Capillary 100 (H) 65 - 99 mg/dL    Labs Reviewed - No data to display  No results found.     ASSESSMENT & PLAN:  1. Influenza-like illness     Meds ordered this encounter  Medications  . acetaminophen (TYLENOL) tablet 650 mg  . HYDROcodone-homatropine (HYDROMET) 5-1.5 MG/5ML syrup    Sig: Take 5 mLs by mouth every 6 (six) hours as needed for cough.    Dispense:  60 mL    Refill:  0  . oseltamivir (TAMIFLU) 75 MG capsule    Sig: Take 1 capsule (75 mg total) by mouth every 12 (twelve) hours.    Dispense:  10 capsule    Refill:  0    Reviewed  expectations re: course of current medical issues. Questions answered. Outlined signs and symptoms indicating need for more acute intervention. Patient verbalized understanding. After Visit Summary given.      Elvina Sidle, MD 11/18/17 [phone]

## 2017-11-18 NOTE — ED Triage Notes (Signed)
Pt has been suffering from headache, cough and fever since Monday.  She reports a fever of 102 at home.

## 2018-01-14 ENCOUNTER — Other Ambulatory Visit: Payer: Self-pay

## 2018-01-14 ENCOUNTER — Encounter (HOSPITAL_COMMUNITY): Payer: Self-pay | Admitting: Emergency Medicine

## 2018-01-14 ENCOUNTER — Ambulatory Visit (HOSPITAL_COMMUNITY)
Admission: EM | Admit: 2018-01-14 | Discharge: 2018-01-14 | Disposition: A | Discharge status: Home / Self Care | Payer: BLUE CROSS/BLUE SHIELD

## 2018-01-14 DIAGNOSIS — M5416 Radiculopathy, lumbar region: Secondary | ICD-10-CM

## 2018-01-14 DIAGNOSIS — M5441 Lumbago with sciatica, right side: Secondary | ICD-10-CM

## 2018-01-14 DIAGNOSIS — M545 Low back pain: Secondary | ICD-10-CM

## 2018-01-14 DIAGNOSIS — M5431 Sciatica, right side: Secondary | ICD-10-CM

## 2018-01-14 DIAGNOSIS — M25551 Pain in right hip: Secondary | ICD-10-CM | POA: Diagnosis not present

## 2018-01-14 DIAGNOSIS — M5410 Radiculopathy, site unspecified: Secondary | ICD-10-CM

## 2018-01-14 LAB — POCT URINALYSIS DIP (DEVICE)
Bilirubin Urine: NEGATIVE
GLUCOSE, UA: NEGATIVE mg/dL
Hgb urine dipstick: NEGATIVE
Ketones, ur: NEGATIVE mg/dL
LEUKOCYTES UA: NEGATIVE
Nitrite: NEGATIVE
Protein, ur: NEGATIVE mg/dL
SPECIFIC GRAVITY, URINE: 1.02 (ref 1.005–1.030)
Urobilinogen, UA: 0.2 mg/dL (ref 0.0–1.0)
pH: 7 (ref 5.0–8.0)

## 2018-01-14 MED ORDER — METHYLPREDNISOLONE ACETATE 80 MG/ML IJ SUSP
INTRAMUSCULAR | Status: AC
  Filled 2018-01-14: qty 1

## 2018-01-14 MED ORDER — CYCLOBENZAPRINE HCL 5 MG PO TABS: 5.0000 mg | ORAL_TABLET | Freq: Three times a day (TID) | ORAL | 0 refills | Status: DC | PRN

## 2018-01-14 MED ORDER — METHYLPREDNISOLONE ACETATE 80 MG/ML IJ SUSP
80.0000 mg | Freq: Once | INTRAMUSCULAR | Status: AC
  Administered 2018-01-14: 80 mg via INTRAMUSCULAR

## 2018-01-14 NOTE — ED Provider Notes (Signed)
MRN: [id number] DOB: [birth year]  Subjective:   Gina Meyers is a 57 y.o. female presenting for 2-week history of right-sided low back/hip pain that radiates into posterior thigh down to about her knee.  She states that she also feels some pain anteriorly over her right hip.  Has associated tingling and burning sensation of her thigh.  She denies falls, trauma.  She has a history of sciatica last treated about 5 years ago with steroid injection which resolved her symptoms completely.  She has been using meloxicam with only temporary relief of her pain.  Denies dysuria, hematuria, urinary frequency, weakness.  Of note, patient works in a very difficult job where she has long shifts and has to stand for the entire shift.  She is currently working on finding a new job.  No current facility-administered medications for this encounter.   Current Outpatient Medications:  .  meloxicam (MOBIC) 15 MG tablet, Take 15 mg by mouth daily., Disp: , Rfl:  .  albuterol (PROVENTIL HFA;VENTOLIN HFA) 108 (90 BASE) MCG/ACT inhaler, Inhale 1-2 puffs into the lungs every 6 (six) hours as needed for wheezing or shortness of breath., Disp: , Rfl:  .  ALPRAZolam (XANAX) 0.5 MG tablet, Take 1 tablet (0.5 mg total) by mouth 2 (two) times daily as needed for anxiety. Take one tablet prior to MRI testing, and one during testing. PLEASE DONT DRIVE WHILE TAKING THIS MEDICATION. PLEASE HAVE A DRIVER TO AND FROM THE TEST., Disp: 2 tablet, Rfl: 0 .  ALPRAZolam (XANAX) 0.5 MG tablet, Take 1 tablet (0.5 mg total) by mouth at bedtime as needed for anxiety or sleep. Do not exceed 3 a week., Disp: 30 tablet, Rfl: 0 .  escitalopram (LEXAPRO) 20 MG tablet, Take 1 tablet (20 mg total) by mouth daily., Disp: 30 tablet, Rfl: 5 .  estradiol (ESTRACE) 1 MG tablet, Take 1 mg by mouth daily., Disp: , Rfl:    Allergies  Allergen Reactions  . Sulfa Antibiotics Hives    Past Medical History:  Diagnosis Date  . Anxiety   . Depression   .  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 Herpes   . Seasonal allergies      Past Surgical History:  Procedure Laterality Date  . CESAREAN SECTION    . CYST EXCISION    . TUBAL LIGATION      Objective:   Vitals: BP 120/77 (BP Location: Right Arm)   Pulse 66   Temp 98.1 F (36.7 C) (Oral)   SpO2 100%   Physical Exam  Constitutional: She is oriented to person, place, and time. She appears well-developed and well-nourished.  Cardiovascular: Normal rate.  Pulmonary/Chest: Effort normal.  Musculoskeletal:       Lumbar back: She exhibits decreased range of motion (full flexion and extension) and tenderness. She exhibits no bony tenderness, no swelling, no edema, no deformity, no laceration and no spasm.       Back:  Neurological: She is alert and oriented to person, place, and time.  Skin: Skin is warm and dry.    Results for orders placed or performed during the hospital encounter of 01/14/18 (from the past 24 hour(s))  POCT urinalysis dip (device)     Status: None   Collection Time: 01/14/18  5:32 PM  Result Value Ref Range   Glucose, UA NEGATIVE NEGATIVE mg/dL   Bilirubin Urine NEGATIVE NEGATIVE   Ketones, ur NEGATIVE NEGATIVE mg/dL   Specific Gravity, Urine 1.020 1.005 - 1.030  Hgb urine dipstick NEGATIVE NEGATIVE   pH 7.0 5.0 - 8.0   Protein, ur NEGATIVE NEGATIVE mg/dL   Urobilinogen, UA 0.2 0.0 - 1.0 mg/dL   Nitrite NEGATIVE NEGATIVE   Leukocytes, UA NEGATIVE NEGATIVE    Assessment and Plan :   Right hip pain  Radicular pain of right lower extremity  Sciatica of right side  We will manage her symptoms for sciatica with IM Depo-Medrol given that meloxicam has not helped her achieve resolution of her pain.  Offered patient prescription for Flexeril to help with any back spasm she is having associated with this.  She is to hydrate very well.  Patient will check back with her PCP to see if they need to pursue more imaging.   Wallis BambergMani, Chrisopher Pustejovsky,  [place]-C 01/14/18 1904

## 2018-01-14 NOTE — Discharge Instructions (Signed)
Hydrate well with at least 2 liters (1 gallon) of water daily.  °

## 2018-01-14 NOTE — ED Triage Notes (Signed)
C/o right side flank pain that radiates to abdomen onset 2 weeks, denies dysuria or vaginal changes

## 2018-04-08 LAB — GLUCOSE, POCT (MANUAL RESULT ENTRY): POC Glucose: 115 mg/dl — AB (ref 70–99)

## 2018-10-24 ENCOUNTER — Emergency Department (HOSPITAL_COMMUNITY)
Admission: EM | Admit: 2018-10-24 | Discharge: 2018-10-24 | Disposition: A | Discharge status: Home / Self Care | Payer: BLUE CROSS/BLUE SHIELD | Attending: Emergency Medicine | Admitting: Emergency Medicine

## 2018-10-24 ENCOUNTER — Encounter (HOSPITAL_COMMUNITY): Payer: Self-pay | Admitting: Internal Medicine

## 2018-10-24 DIAGNOSIS — Y990 Civilian activity done for income or pay: Secondary | ICD-10-CM | POA: Insufficient documentation

## 2018-10-24 DIAGNOSIS — X500XXA Overexertion from strenuous movement or load, initial encounter: Secondary | ICD-10-CM | POA: Insufficient documentation

## 2018-10-24 DIAGNOSIS — Y9289 Other specified places as the place of occurrence of the external cause: Secondary | ICD-10-CM | POA: Insufficient documentation

## 2018-10-24 DIAGNOSIS — M779 Enthesopathy, unspecified: Secondary | ICD-10-CM

## 2018-10-24 DIAGNOSIS — Y9389 Activity, other specified: Secondary | ICD-10-CM | POA: Insufficient documentation

## 2018-10-24 DIAGNOSIS — E119 Type 2 diabetes mellitus without complications: Secondary | ICD-10-CM | POA: Insufficient documentation

## 2018-10-24 DIAGNOSIS — S46811A Strain of other muscles, fascia and tendons at shoulder and upper arm level, right arm, initial encounter: Secondary | ICD-10-CM | POA: Insufficient documentation

## 2018-10-24 DIAGNOSIS — Z79899 Other long term (current) drug therapy: Secondary | ICD-10-CM | POA: Insufficient documentation

## 2018-10-24 MED ORDER — IBUPROFEN 400 MG PO TABS
600.0000 mg | ORAL_TABLET | Freq: Once | ORAL | Status: AC
  Administered 2018-10-24: 600 mg via ORAL
  Filled 2018-10-24: qty 1

## 2018-10-24 MED ORDER — DEXAMETHASONE SODIUM PHOSPHATE 10 MG/ML IJ SOLN
10.0000 mg | Freq: Once | INTRAMUSCULAR | Status: AC
  Administered 2018-10-24: 10 mg via INTRAMUSCULAR
  Filled 2018-10-24: qty 1

## 2018-10-24 NOTE — ED Provider Notes (Addendum)
[facility] EMERGENCY DEPARTMENT Provider Note   CSN: [id number] Arrival date & time: 10/24/18  0730     History   Chief Complaint Chief Complaint  Patient presents with  . right side pain    HPI Gina Meyers is a 58 y.o. female.  Patient with history of depression, anxiety, seasonal allergies presents with primarily right shoulder pain.  Worsening the past few days.  Patient does lift regularly at work and stands all day.  Patient has been in the cold more recently.  Pain worse with movement.  Clarified with patient and she has had leg symptoms prior to this event and lower back pain chronic.  Patient's had gradually worsening symptoms.  No fevers or direct trauma.  No chest pain or shortness of breath currently.     Past Medical History:  Diagnosis Date  . Anxiety   . Depression   . 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 Herpes   . Seasonal allergies     Patient Active Problem List   Diagnosis Date Noted  . Palpitations 12/30/2013  . Chest pain 12/30/2013    Past Surgical History:  Procedure Laterality Date  . CESAREAN SECTION    . CYST EXCISION    . TUBAL LIGATION       OB History   No obstetric history on file.      Home Medications    Prior to Admission medications   Medication Sig Start Date End Date Taking? Authorizing Provider  albuterol (PROVENTIL HFA;VENTOLIN HFA) 108 (90 BASE) MCG/ACT inhaler Inhale 1-2 puffs into the lungs every 6 (six) hours as needed for wheezing or shortness of breath.    [provider]  ALPRAZolam Prudy Feeler) 0.5 MG tablet Take 1 tablet (0.5 mg total) by mouth 2 (two) times daily as needed for anxiety. Take one tablet prior to MRI testing, and one during testing. PLEASE DONT DRIVE WHILE TAKING THIS MEDICATION. PLEASE HAVE A DRIVER TO AND FROM THE TEST. 09/30/16   Micki Riley, MD  ALPRAZolam Prudy Feeler) 0.5 MG tablet Take 1 tablet (0.5 mg total) by mouth at bedtime as needed for  anxiety or sleep. Do not exceed 3 a week. 10/08/16   Dohmeier, Porfirio Mylar, MD  cyclobenzaprine (FLEXERIL) 5 MG tablet Take 1 tablet (5 mg total) by mouth 3 (three) times daily as needed for muscle spasms. 01/14/18   Wallis Bamberg, PA-C  escitalopram (LEXAPRO) 20 MG tablet Take 1 tablet (20 mg total) by mouth daily. 08/25/16   Dohmeier, Porfirio Mylar, MD  estradiol (ESTRACE) 1 MG tablet Take 1 mg by mouth daily.    [provider]  meloxicam (MOBIC) 15 MG tablet Take 15 mg by mouth daily.    [provider]    Family History No family history on file.  Social History Social History   Tobacco Use  . Smoking status: Never Smoker  . Smokeless tobacco: Never Used  Substance Use Topics  . Alcohol use: No  . Drug use: No     Allergies   Sulfa antibiotics   Review of Systems Review of Systems  Constitutional: Negative for chills and fever.  HENT: Negative for congestion.   Respiratory: Negative for shortness of breath.   Cardiovascular: Negative for chest pain.  Gastrointestinal: Negative for abdominal pain and vomiting.  Genitourinary: Negative for dysuria and flank pain.  Musculoskeletal: Positive for arthralgias and back pain. Negative for neck pain and neck stiffness.  Skin: Negative for rash.  Neurological:  Negative for weakness, light-headedness, numbness and headaches.     Physical Exam Updated Vital Signs BP 116/86   Pulse 75   Temp 98.3 F (36.8 C) (Oral)   Resp 12   SpO2 99%   Physical Exam Vitals signs and nursing note reviewed.  Constitutional:      Appearance: She is well-developed.  HENT:     Head: Normocephalic and atraumatic.  Eyes:     General:        Right eye: No discharge.        Left eye: No discharge.     Conjunctiva/sclera: Conjunctivae normal.  Neck:     Musculoskeletal: Normal range of motion and neck supple.     Trachea: No tracheal deviation.  Cardiovascular:     Rate and Rhythm: Normal rate and regular rhythm.  Pulmonary:      Effort: Pulmonary effort is normal.     Breath sounds: Normal breath sounds.  Abdominal:     General: There is no distension.     Palpations: Abdomen is soft.     Tenderness: There is no abdominal tenderness. There is no guarding.  Musculoskeletal:        General: Tenderness present. No swelling, deformity or signs of injury.     Comments: Patient has tight musculature right trapezius and reproducible tenderness along that muscle region.  Patient has pain mild with active abduction and mild tenderness with empty can test however no giveaway weakness.  Neurovascularly intact upper extremities.  No midline cervical tenderness.  Patient also has mild tenderness bilateral Achilles region to palpation.  No evidence of significant Achilles tear. 5+ strength w f/e at major joints and w abduction UEs bilateral  Skin:    General: Skin is warm.     Findings: No rash.  Neurological:     Mental Status: She is alert and oriented to person, place, and time.  Psychiatric:        Mood and Affect: Mood normal.      ED Treatments / Results  Labs (all labs ordered are listed, but only abnormal results are displayed) Labs Reviewed - No data to display  EKG None  Radiology No results found.  Procedures Procedures (including critical care time)  Medications Ordered in ED Medications  dexamethasone (DECADRON) injection 10 mg (has no administration in time range)  ibuprofen (ADVIL,MOTRIN) tablet 600 mg (600 mg Oral Given 10/24/18 0844)     Initial Impression / Assessment and Plan / ED Course  I have reviewed the triage vital signs and the nursing notes.  Pertinent labs & imaging results that were available during my care of the patient were reviewed by me and considered in my medical decision making (see chart for details).    Well-appearing patient presents in with various joint pains and muscle spasm of trapezius.  We discussed in detail differential, plan of care and follow-up.  Patient is  looking forward to trying water therapy and physical therapy through her primary doctor and [facility]  Offered screening EKG patient prefers to follow-up with her primary doctor for this. Decadron given to help her through her worsening arthritis/tendonitis type symptoms.  Results and differential diagnosis were discussed with the patient/parent/guardian. Xrays were independently reviewed by myself.  Close follow up outpatient was discussed, comfortable with the plan.   Medications  dexamethasone (DECADRON) injection 10 mg (has no administration in time range)  ibuprofen (ADVIL,MOTRIN) tablet 600 mg (600 mg Oral Given 10/24/18 0844)  Vitals:   10/24/18 0746  BP: 116/86  Pulse: 75  Resp: 12  Temp: 98.3 F (36.8 C)  TempSrc: Oral  SpO2: 99%    Final diagnoses:  Strain of right trapezius muscle, initial encounter  Tendonitis     Final Clinical Impressions(s) / ED Diagnoses   Final diagnoses:  Strain of right trapezius muscle, initial encounter  Tendonitis    ED Discharge Orders    None       Blane OharaZavitz, Jaionna Weisse, MD 10/24/18 0900    Blane OharaZavitz, Lerlene Treadwell, MD 10/24/18 [phone]

## 2018-10-24 NOTE — ED Triage Notes (Signed)
Pt complains of right side pain that starts from her right shoulder and goes all the way down her right leg. Onset this morning.

## 2018-10-24 NOTE — Discharge Instructions (Addendum)
Follow-up with your primary doctor to help schedule physical therapy and water therapy. See sports medicine if worsening symptoms or no improvement over the next few weeks.  Take Tylenol and Motrin as needed and use ice regularly for pain. Return for chest pain, shortness of breath, fevers or other concerns.

## 2019-06-15 ENCOUNTER — Other Ambulatory Visit: Payer: Self-pay

## 2019-06-15 ENCOUNTER — Ambulatory Visit: Payer: Self-pay | Admitting: Internal Medicine

## 2019-06-15 ENCOUNTER — Encounter: Payer: Self-pay | Admitting: Internal Medicine

## 2019-06-15 VITALS — BP 116/83 | HR 78 | Temp 98.1°F | Ht 65.5 in | Wt 191.1 lb

## 2019-06-15 DIAGNOSIS — Z0000 Encounter for general adult medical examination without abnormal findings: Secondary | ICD-10-CM | POA: Insufficient documentation

## 2019-06-15 DIAGNOSIS — J302 Other seasonal allergic rhinitis: Secondary | ICD-10-CM

## 2019-06-15 DIAGNOSIS — Z23 Encounter for immunization: Secondary | ICD-10-CM

## 2019-06-15 DIAGNOSIS — F329 Major depressive disorder, single episode, unspecified: Secondary | ICD-10-CM

## 2019-06-15 DIAGNOSIS — F419 Anxiety disorder, unspecified: Secondary | ICD-10-CM

## 2019-06-15 DIAGNOSIS — Z79899 Other long term (current) drug therapy: Secondary | ICD-10-CM

## 2019-06-15 DIAGNOSIS — I10 Essential (primary) hypertension: Secondary | ICD-10-CM

## 2019-06-15 DIAGNOSIS — R7303 Prediabetes: Secondary | ICD-10-CM

## 2019-06-15 DIAGNOSIS — Z87891 Personal history of nicotine dependence: Secondary | ICD-10-CM

## 2019-06-15 LAB — GLUCOSE, CAPILLARY: Glucose-Capillary: 103 mg/dL — ABNORMAL HIGH (ref 70–99)

## 2019-06-15 LAB — POCT GLYCOSYLATED HEMOGLOBIN (HGB A1C): Hemoglobin A1C: 6.1 % — AB (ref 4.0–5.6)

## 2019-06-15 NOTE — Progress Notes (Addendum)
CC: Establish care for hypertension   HPI:  Ms.Gina Meyers is a 58 y.o. female with depression, anxiety, and prediabetes who presents to establish care for anxiety. Please see problem based charting for evaluation, assessment, and plan.   Past Medical History:  Diagnosis Date  . Anxiety   . Depression   . Diabetes mellitus without complication ([place])    pre diabetic  . Herpes   . Seasonal allergies    Review of Systems:    Review of Systems  Constitutional: Negative for chills and fever.  Respiratory: Negative for cough and shortness of breath.   Cardiovascular: Negative for chest pain.  Gastrointestinal: Negative for abdominal pain, nausea and vomiting.  Neurological: Negative for dizziness, tingling and headaches.  Psychiatric/Behavioral: The patient is nervous/anxious.    Family History  Problem Relation Age of Onset  . Diabetes Mother   . Heart defect Father        some valvular issue   Social History   Socioeconomic History  . Marital status: Divorced    Spouse name: Not on file  . Number of children: Not on file  . Years of education: Not on file  . Highest education level: Not on file  Occupational History  . Not on file  Social Needs  . Financial resource strain: Not on file  . Food insecurity    Worry: Not on file    Inability: Not on file  . Transportation needs    Medical: Not on file    Non-medical: Not on file  Tobacco Use  . Smoking status: Former Smoker    Packs/day: 0.25    Types: Cigarettes    Start date: 43    Quit date: 1995    Years since quitting: 25.7  . Smokeless tobacco: Never Used  Substance and Sexual Activity  . Alcohol use: Yes  . Drug use: No  . Sexual activity: Not on file  Lifestyle  . Physical activity    Days per week: Not on file    Minutes per session: Not on file  . Stress: Not on file  Relationships  . Social Herbalist on phone: Not on file    Gets together: Not on file    Attends religious  service: Not on file    Active member of club or organization: Not on file    Attends meetings of clubs or organizations: Not on file    Relationship status: Not on file  Other Topics Concern  . Not on file  Social History Narrative  . Not on file   Recently divorced, works for public school system   Physical Exam:  Vitals:   06/15/19 1542  BP: 116/83  Pulse: 78  Temp: 98.1 F (36.7 C)  TempSrc: Oral  SpO2: 99%  Weight: 191 lb 1.6 oz (86.7 kg)  Height: 5' 5.5" (1.664 m)   Physical Exam  Constitutional: Appears well-developed and well-nourished. No distress.  HENT:  Head: Normocephalic and atraumatic.  Eyes: Conjunctivae are normal.  Cardiovascular: Normal rate, regular rhythm and normal heart sounds.  Respiratory: Effort normal and breath sounds normal. No respiratory distress. No wheezes.  GI: Soft. Bowel sounds are normal. No distension. There is no tenderness.  Musculoskeletal: No edema.  Neurological: Is alert.  Skin: Not diaphoretic. No erythema.  Psychiatric: Normal mood and affect. Behavior is normal. Judgment and thought content normal.   Assessment & Plan:   See Encounters Tab for problem based charting.  Patient discussed with  Dr. Evette Doffing

## 2019-06-15 NOTE — Assessment & Plan Note (Signed)
Patient states that she has anxiety to specific situations: when in an elevator alone, when in dark rooms. She notices that her heart races, she gets very nervous, and starts to sweat.   She was given xanax to use previously, but she stopped using this few months ago.   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 Will refer to integrated behavioral health. Asked the patient to continue to avoid xanax.

## 2019-06-15 NOTE — Patient Instructions (Signed)
It was a pleasure to see you today Ms. Gina Meyers. Please make the following changes:  -We did bloodwork today to assess for any kidney issues or electrolyte problems  -I have referred you to our behavioral health counselor  If you have any questions or concerns, please call our clinic at [phone] between 9am-5pm and after hours call [phone] and ask for the internal medicine resident on call. If you feel you are having a medical emergency please call 911.   Thank you, we look forward to help you remain healthy!  Lars Mage, MD Internal Medicine PGY3

## 2019-06-15 NOTE — Assessment & Plan Note (Signed)
Patient has a history of prediabetes documented in her chart. Her elevated bmi puts her at risk for diabetes.   -will check a1c

## 2019-06-15 NOTE — Addendum Note (Signed)
Addended by: Lars Mage on: 06/15/2019 05:06 PM   Modules accepted: Level of Service

## 2019-06-15 NOTE — Assessment & Plan Note (Signed)
The patient has a bmi of 31 which places her in obese range. I will check bmp to evaluate for possible renal dysfunction (FSGS) and electrolyte abnormalities.   Patient was given influenza vaccination during this encounter

## 2019-06-16 ENCOUNTER — Encounter: Payer: Self-pay | Admitting: Internal Medicine

## 2019-06-16 LAB — BMP8+ANION GAP
Anion Gap: 14 mmol/L (ref 10.0–18.0)
BUN/Creatinine Ratio: 13 (ref 9–23)
BUN: 12 mg/dL (ref 6–24)
CO2: 27 mmol/L (ref 20–29)
Calcium: 9.7 mg/dL (ref 8.7–10.2)
Chloride: 99 mmol/L (ref 96–106)
Creatinine, Ser: 0.89 mg/dL (ref 0.57–1.00)
GFR calc Af Amer: 84 mL/min/{1.73_m2} (ref >59)
GFR calc non Af Amer: 73 mL/min/{1.73_m2} (ref >59)
Glucose: 98 mg/dL (ref 65–99)
Potassium: 4.3 mmol/L (ref 3.5–5.2)
Sodium: 140 mmol/L (ref 134–144)

## 2019-06-16 NOTE — Addendum Note (Signed)
Addended by: Lalla Brothers T on: 06/16/2019 10:10 AM   Modules accepted: Level of Service

## 2019-06-16 NOTE — Progress Notes (Signed)
Internal Medicine Clinic Attending  Case discussed with Dr. Chundi at the time of the visit.  We reviewed the resident's history and exam and pertinent patient test results.  I agree with the assessment, diagnosis, and plan of care documented in the resident's note. 

## 2019-06-27 ENCOUNTER — Encounter: Payer: Self-pay | Admitting: Licensed Clinical Social Worker

## 2019-06-27 ENCOUNTER — Telehealth: Payer: Self-pay | Admitting: Licensed Clinical Social Worker

## 2019-06-27 NOTE — Telephone Encounter (Signed)
Patient was called due to a referral from her doctor. Patient did not answer, but a vm was left for the pt to contact our office to establish care.

## 2019-07-05 ENCOUNTER — Encounter: Payer: Self-pay | Admitting: Internal Medicine

## 2019-07-05 ENCOUNTER — Ambulatory Visit: Payer: Medicaid Other

## 2019-07-18 ENCOUNTER — Ambulatory Visit: Payer: Medicaid Other | Admitting: Licensed Clinical Social Worker

## 2019-07-31 NOTE — Addendum Note (Signed)
Addended by: Hulan Fray on: 07/31/2019 06:37 PM   Modules accepted: Orders

## 2019-10-11 ENCOUNTER — Other Ambulatory Visit: Payer: Self-pay

## 2019-10-11 ENCOUNTER — Encounter (HOSPITAL_COMMUNITY): Payer: Self-pay

## 2019-10-11 ENCOUNTER — Ambulatory Visit (HOSPITAL_COMMUNITY)
Admission: EM | Admit: 2019-10-11 | Discharge: 2019-10-11 | Disposition: A | Discharge status: Home / Self Care | Payer: 59 | Attending: Urgent Care | Admitting: Urgent Care

## 2019-10-11 DIAGNOSIS — M79672 Pain in left foot: Secondary | ICD-10-CM | POA: Diagnosis not present

## 2019-10-11 DIAGNOSIS — M2550 Pain in unspecified joint: Secondary | ICD-10-CM

## 2019-10-11 DIAGNOSIS — M79671 Pain in right foot: Secondary | ICD-10-CM

## 2019-10-11 DIAGNOSIS — G8929 Other chronic pain: Secondary | ICD-10-CM | POA: Diagnosis not present

## 2019-10-11 DIAGNOSIS — M79641 Pain in right hand: Secondary | ICD-10-CM | POA: Diagnosis not present

## 2019-10-11 DIAGNOSIS — M25561 Pain in right knee: Secondary | ICD-10-CM

## 2019-10-11 DIAGNOSIS — M79642 Pain in left hand: Secondary | ICD-10-CM | POA: Diagnosis not present

## 2019-10-11 DIAGNOSIS — M25571 Pain in right ankle and joints of right foot: Secondary | ICD-10-CM

## 2019-10-11 DIAGNOSIS — M25562 Pain in left knee: Secondary | ICD-10-CM

## 2019-10-11 MED ORDER — MELOXICAM 15 MG PO TABS: 15.0000 mg | ORAL_TABLET | Freq: Every day | ORAL | 0 refills | Status: AC

## 2019-10-11 MED ORDER — METHYLPREDNISOLONE ACETATE 80 MG/ML IJ SUSP
INTRAMUSCULAR | Status: AC
  Filled 2019-10-11: qty 1

## 2019-10-11 MED ORDER — METHYLPREDNISOLONE ACETATE 80 MG/ML IJ SUSP
80.0000 mg | Freq: Once | INTRAMUSCULAR | Status: AC
  Administered 2019-10-11: 80 mg via INTRAMUSCULAR

## 2019-10-11 NOTE — Discharge Instructions (Addendum)
At the end of each shift ice your more painful joints 20 minutes. You may take 500mg -650mg  Tylenol every 6 hours for pain and inflammation.

## 2019-10-11 NOTE — ED Triage Notes (Signed)
Patient presents to Urgent Care with complaints of joint pain on her feet and hands since about 3 days ago. Patient reports she thinks she is having an arthritis flare up.

## 2019-10-11 NOTE — ED Provider Notes (Signed)
[place]   MRN: [id number] DOB: Oct 20, 1960  Subjective:   Gina Meyers is a 59 y.o. female presenting for 3 day history of acute onset of bilateral hand and feet pain. Has been using meloxicam for arthritic type pain. She is out of this medication. Has also had intermittent ankle and knee pain. Patient works at [employer], does strenuous work on her feet all throughout her shift. She has previously done very well with steroid injection and is requesting this. She is currently in the process of establishing with a new PCP but is having a difficult time finding a provider that will see her soon and is in her network.  No current facility-administered medications for this encounter.  Current Outpatient Medications:  .  albuterol (PROVENTIL HFA;VENTOLIN HFA) 108 (90 BASE) MCG/ACT inhaler, Inhale 1-2 puffs into the lungs every 6 (six) hours as needed for wheezing or shortness of breath., Disp: , Rfl:  .  estradiol (ESTRACE) 1 MG tablet, Take 1 mg by mouth daily., Disp: , Rfl:  .  fluticasone (FLONASE) 50 MCG/ACT nasal spray, Place into both nostrils daily., Disp: , Rfl:  .  meloxicam (MOBIC) 15 MG tablet, Take 15 mg by mouth daily., Disp: , Rfl:    Allergies  Allergen Reactions  . Sulfa Antibiotics Hives    Past Medical History:  Diagnosis Date  . Anxiety   . Depression   . Diabetes mellitus without complication ([place])    pre diabetic  . Herpes   . Seasonal allergies      Past Surgical History:  Procedure Laterality Date  . CESAREAN SECTION    . CYST EXCISION    . TUBAL LIGATION      Family History  Problem Relation Age of Onset  . Diabetes Mother   . Heart defect Father        some valvular issue    Social History   Tobacco Use  . Smoking status: Former Smoker    Packs/day: 0.25    Types: Cigarettes    Start date: 89    Quit date: 1995    Years since quitting: 26.0  . Smokeless tobacco: Never Used  Substance Use Topics  . Alcohol use: Not Currently   Comment: rare  . Drug use: No    ROS   Objective:   Vitals: BP (!) 141/81 (BP Location: Right Arm)   Pulse 76   Temp 98.2 F (36.8 C) (Oral)   Resp 15   SpO2 100%   Physical Exam Constitutional:      General: She is not in acute distress.    Appearance: Normal appearance. She is well-developed. She is not ill-appearing, toxic-appearing or diaphoretic.  HENT:     Head: Normocephalic and atraumatic.     Nose: Nose normal.     Mouth/Throat:     Mouth: Mucous membranes are moist.     Pharynx: Oropharynx is clear.  Eyes:     General: No scleral icterus.    Extraocular Movements: Extraocular movements intact.     Pupils: Pupils are equal, round, and reactive to light.  Cardiovascular:     Rate and Rhythm: Normal rate.  Pulmonary:     Effort: Pulmonary effort is normal.  Skin:    General: Skin is warm and dry.  Neurological:     General: No focal deficit present.     Mental Status: She is alert and oriented to person, place, and time.  Psychiatric:        Mood and  Affect: Mood normal.        Behavior: Behavior normal.      Assessment and Plan :   1. Chronic pain of multiple joints   2. Bilateral hand pain   3. Bilateral foot pain   4. Bilateral chronic knee pain   5. Chronic ankle pain, bilateral     Will use IM Depomedrol for multiple joint pain which I suspect is inflammatory in nature secondary to the nature of her strenuous work.  I also refilled her meloxicam for maintenance therapy given that she is going to be working on finding a PCP. Counseled patient on potential for adverse effects with medications prescribed/recommended today, ER and return-to-clinic precautions discussed, patient verbalized understanding.    Wallis Bamberg, PA-C 10/11/19 1739

## 2019-11-28 ENCOUNTER — Encounter (HOSPITAL_COMMUNITY): Payer: Self-pay

## 2019-11-28 ENCOUNTER — Other Ambulatory Visit: Payer: Self-pay

## 2019-11-28 ENCOUNTER — Ambulatory Visit (HOSPITAL_COMMUNITY)
Admission: EM | Admit: 2019-11-28 | Discharge: 2019-11-28 | Disposition: A | Discharge status: Home / Self Care | Payer: 59 | Attending: Family Medicine | Admitting: Family Medicine

## 2019-11-28 DIAGNOSIS — M79642 Pain in left hand: Secondary | ICD-10-CM

## 2019-11-28 DIAGNOSIS — L239 Allergic contact dermatitis, unspecified cause: Secondary | ICD-10-CM

## 2019-11-28 DIAGNOSIS — R21 Rash and other nonspecific skin eruption: Secondary | ICD-10-CM | POA: Diagnosis not present

## 2019-11-28 MED ORDER — PREDNISONE 10 MG PO TABS: ORAL_TABLET | ORAL | 0 refills | Status: DC

## 2019-11-28 MED ORDER — TRIAMCINOLONE ACETONIDE 0.1 % EX CREA: 1.0000 "application " | TOPICAL_CREAM | Freq: Two times a day (BID) | CUTANEOUS | 0 refills | Status: AC

## 2019-11-28 NOTE — Discharge Instructions (Signed)
Begin prednisone taper over the next 6 days-begin with 6 tablets today, take with food, decrease by 1 tablet each day, until complete-6, 5, 4, 3, 2, 1-take with food and earlier in the day if you are able May apply triamcinolone/Kenalog cream twice daily to areas of significant itching Please use daily cetirizine/Zyrtec or Claritin to help with itching May use Benadryl at nighttime help with nighttime itching/sleep  May resume using Mobic after completion of prednisone  Please follow-up if any symptoms not improving or worsening

## 2019-11-28 NOTE — ED Triage Notes (Signed)
Pt present left hand pain and a rash that is located on the back of her left arm and neck. The has is itching and burning.  She recently received her covid vaccine on 11/25/19

## 2019-11-28 NOTE — ED Provider Notes (Signed)
[facility]    CSN: [id number] Arrival date & time: 11/28/19  1017      History   Chief Complaint Chief Complaint  Patient presents with  . Hand Pain    left   . Rash    left arm  and back of neck    HPI Gina Meyers is a 59 y.o. female history of prediabetes, presenting today for evaluation of left hand pain and rash.  Patient notes that she recently received her Covid vaccine and subsequently has developed a rash to bilateral upper extremities, neck and right upper leg.  Rashes associated with itching.  Has been present since Saturday over the past 4 days.  Area on neck has been recurrent off and on for a few months.  She wonders if this area on her neck could be triggered by sulfur shampoo that she uses as she has allergy to sulfa antibiotics.  Rash limited to nape of neck, denies diffuse scalp itching and rash.  Denies fevers.  Vaccine given in left upper arm.  Denies difficulty breathing or shortness of breath.  She has also had increased pain in her left hand.  Since Friday.  Pain mainly with movement.  Denies injury fall or trauma.  Denies overlying redness.  On chart review appears patient has chronic hand and joint pain.  She was previously using meloxicam 15 mg as needed which does provide some relief.  HPI  Past Medical History:  Diagnosis Date  . Anxiety   . Depression   . 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 Herpes   . Seasonal allergies     Patient Active Problem List   Diagnosis Date Noted  . Anxiety disorder 06/15/2019  . Healthcare maintenance 06/15/2019  . Prediabetes 06/15/2019  . Palpitations 12/30/2013  . Chest pain 12/30/2013    Past Surgical History:  Procedure Laterality Date  . CESAREAN SECTION    . CYST EXCISION    . TUBAL LIGATION      OB History   No obstetric history on file.      Home Medications    Prior to Admission medications   Medication Sig Start Date End Date Taking? Authorizing  Provider  albuterol (PROVENTIL HFA;VENTOLIN HFA) 108 (90 BASE) MCG/ACT inhaler Inhale 1-2 puffs into the lungs every 6 (six) hours as needed for wheezing or shortness of breath.    [provider]  estradiol (ESTRACE) 1 MG tablet Take 1 mg by mouth daily.    [provider]  fluticasone (FLONASE) 50 MCG/ACT nasal spray Place into both nostrils daily.    [provider]  meloxicam (MOBIC) 15 MG tablet Take 1 tablet (15 mg total) by mouth daily. 10/11/19   Wallis Bamberg, PA-C  predniSONE (DELTASONE) 10 MG tablet Begin with 6 tabs on day 1, 5 tab on day 2, 4 tab on day 3, 3 tab on day 4, 2 tab on day 5, 1 tab on day 6-take with food 11/28/19   Lynnda Wiersma C, PA-C  triamcinolone cream (KENALOG) 0.1 % Apply 1 application topically 2 (two) times daily. 11/28/19   Cyniah Gossard, Junius Creamer, PA-C    Family History Family History  Problem Relation Age of Onset  . Diabetes Mother   . Heart defect Father        some valvular issue    Social History Social History   Tobacco Use  . Smoking status: Former Smoker    Packs/day: 0.25  Types: Cigarettes    Start date: 15    Quit date: 1995    Years since quitting: 26.1  . Smokeless tobacco: Never Used  Substance Use Topics  . Alcohol use: Not Currently    Comment: rare  . Drug use: No     Allergies   Sulfa antibiotics   Review of Systems Review of Systems  Constitutional: Negative for fatigue and fever.  HENT: Negative for mouth sores.   Eyes: Negative for visual disturbance.  Respiratory: Negative for shortness of breath.   Cardiovascular: Negative for chest pain.  Gastrointestinal: Negative for abdominal pain, nausea and vomiting.  Musculoskeletal: Positive for arthralgias. Negative for joint swelling.  Skin: Positive for color change and rash. Negative for wound.  Neurological: Negative for dizziness, weakness, light-headedness and headaches.     Physical Exam Triage Vital Signs ED Triage Vitals  Enc  Vitals Group     BP 11/28/19 1101 108/76     Pulse Rate 11/28/19 1101 70     Resp --      Temp 11/28/19 1101 98 F (36.7 C)     Temp Source 11/28/19 1101 Oral     SpO2 11/28/19 1101 98 %     Weight --      Height --      Head Circumference --      Peak Flow --      Pain Score 11/28/19 1100 3     Pain Loc --      Pain Edu? --      Excl. in GC? --    No data found.  Updated Vital Signs BP 108/76 (BP Location: Right Arm)   Pulse 70   Temp 98 F (36.7 C) (Oral)   SpO2 98%   Visual Acuity Right Eye Distance:   Left Eye Distance:   Bilateral Distance:    Right Eye Near:   Left Eye Near:    Bilateral Near:     Physical Exam Vitals and nursing note reviewed.  Constitutional:      General: She is not in acute distress.    Appearance: She is well-developed.  HENT:     Head: Normocephalic and atraumatic.  Eyes:     Conjunctiva/sclera: Conjunctivae normal.  Cardiovascular:     Rate and Rhythm: Normal rate and regular rhythm.     Heart sounds: No murmur.  Pulmonary:     Effort: Pulmonary effort is normal. No respiratory distress.     Breath sounds: Normal breath sounds.     Comments: Breathing comfortably at rest, CTABL, no wheezing, rales or other adventitious sounds auscultated Abdominal:     Palpations: Abdomen is soft.     Tenderness: There is no abdominal tenderness.  Musculoskeletal:     Cervical back: Neck supple.     Comments: Left hand: No obvious deformity or discoloration, mild swelling noted between second and third MCP joints, minimal tenderness to palpation over metacarpals extending into proximal phalanx of second and third fingers, nontender over DIP and PIP, pain elicited with flexion; full active range of motion of all fingers at MCP joints and DIP and PIP.  Radial pulse 2+  Skin:    General: Skin is warm and dry.     Findings: Erythema and rash present.     Comments: Bilateral posterior aspects of upper arms with erythematous slightly raised  blanchable maculopapular rash, rash on left more prominent than right, similar rash to right anterior thigh, 2 lesions with central clearing  Left nape  of neck with patch of erythema, no rash or other lesions noted within scalp  Neurological:     Mental Status: She is alert.      UC Treatments / Results  Labs (all labs ordered are listed, but only abnormal results are displayed) Labs Reviewed - No data to display  EKG   Radiology No results found.  Procedures Procedures (including critical care time)  Medications Ordered in UC Medications - No data to display  Initial Impression / Assessment and Plan / UC Course  I have reviewed the triage vital signs and the nursing notes.  Pertinent labs & imaging results that were available during my care of the patient were reviewed by me and considered in my medical decision making (see chart for details).     Rash does appear allergic given urticarial appearance, seems less likely given nature of rash, possibly related to recent vaccine.  No airway compromise.  Will treat with course of prednisone, taper x6 days, also recommending antihistamines.  Providing topical triamcinolone to use topically as needed for recurrent rash at neck as well as for any persistent areas with significant itching despite oral steroids.  Advised to use no more than twice a day to avoid skin discoloration.  Hand pain most likely inflammatory/ possible underlying arthritis, do not suspect acute bony abnormality at this time.  No erythema or warmth, do not suspect gout or cellulitis.  Would expect prednisone for rash to also help with pain in hand.  May return to using Mobic after completion of prednisone.  Discussed strict return precautions. Patient verbalized understanding and is agreeable with plan.  Final Clinical Impressions(s) / UC Diagnoses   Final diagnoses:  Left hand pain  Allergic dermatitis  Rash and nonspecific skin eruption     Discharge  Instructions     Begin prednisone taper over the next 6 days-begin with 6 tablets today, take with food, decrease by 1 tablet each day, until complete-6, 5, 4, 3, 2, 1-take with food and earlier in the day if you are able May apply triamcinolone/Kenalog cream twice daily to areas of significant itching Please use daily cetirizine/Zyrtec or Claritin to help with itching May use Benadryl at nighttime help with nighttime itching/sleep  May resume using Mobic after completion of prednisone  Please follow-up if any symptoms not improving or worsening   ED Prescriptions    Medication Sig Dispense Auth. Provider   predniSONE (DELTASONE) 10 MG tablet Begin with 6 tabs on day 1, 5 tab on day 2, 4 tab on day 3, 3 tab on day 4, 2 tab on day 5, 1 tab on day 6-take with food 21 tablet Raj Landress C, PA-C   triamcinolone cream (KENALOG) 0.1 % Apply 1 application topically 2 (two) times daily. 45 g Kizer Nobbe, [place] C, PA-C     PDMP not reviewed this encounter.   Janith Lima, [place] 11/28/19 1510

## 2020-01-17 ENCOUNTER — Encounter: Payer: Self-pay | Admitting: *Deleted

## 2020-12-02 ENCOUNTER — Other Ambulatory Visit: Payer: Self-pay | Admitting: Family Medicine

## 2020-12-02 ENCOUNTER — Ambulatory Visit
Admission: RE | Admit: 2020-12-02 | Discharge: 2020-12-02 | Disposition: A | Discharge status: Home / Self Care | Payer: 59 | Source: Ambulatory Visit | Attending: Family Medicine | Admitting: Family Medicine

## 2020-12-02 DIAGNOSIS — R52 Pain, unspecified: Secondary | ICD-10-CM

## 2021-02-15 ENCOUNTER — Emergency Department (HOSPITAL_COMMUNITY)
Admission: EM | Admit: 2021-02-15 | Discharge: 2021-02-15 | Disposition: A | Discharge status: Home / Self Care | Payer: 59 | Attending: Emergency Medicine | Admitting: Emergency Medicine

## 2021-02-15 ENCOUNTER — Encounter (HOSPITAL_COMMUNITY): Payer: Self-pay | Admitting: Emergency Medicine

## 2021-02-15 ENCOUNTER — Other Ambulatory Visit: Payer: Self-pay

## 2021-02-15 DIAGNOSIS — Y92410 Unspecified street and highway as the place of occurrence of the external cause: Secondary | ICD-10-CM | POA: Insufficient documentation

## 2021-02-15 DIAGNOSIS — M545 Low back pain, unspecified: Secondary | ICD-10-CM | POA: Diagnosis present

## 2021-02-15 DIAGNOSIS — R519 Headache, unspecified: Secondary | ICD-10-CM | POA: Insufficient documentation

## 2021-02-15 DIAGNOSIS — M25511 Pain in right shoulder: Secondary | ICD-10-CM | POA: Insufficient documentation

## 2021-02-15 DIAGNOSIS — Z79891 Long term (current) use of opiate analgesic: Secondary | ICD-10-CM | POA: Insufficient documentation

## 2021-02-15 DIAGNOSIS — E119 Type 2 diabetes mellitus without complications: Secondary | ICD-10-CM | POA: Diagnosis not present

## 2021-02-15 DIAGNOSIS — M25512 Pain in left shoulder: Secondary | ICD-10-CM | POA: Insufficient documentation

## 2021-02-15 MED ORDER — ACETAMINOPHEN 325 MG PO TABS
650.0000 mg | ORAL_TABLET | Freq: Once | ORAL | Status: AC
  Administered 2021-02-15: 650 mg via ORAL
  Filled 2021-02-15: qty 2

## 2021-02-15 MED ORDER — LIDOCAINE 5 % EX PTCH
1.0000 | MEDICATED_PATCH | Freq: Once | CUTANEOUS | Status: DC
  Administered 2021-02-15: 1 via TRANSDERMAL
  Filled 2021-02-15: qty 1
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
## 2021-04-14 ENCOUNTER — Other Ambulatory Visit: Payer: Self-pay | Admitting: Family Medicine

## 2021-04-14 ENCOUNTER — Ambulatory Visit
Admission: RE | Admit: 2021-04-14 | Discharge: 2021-04-14 | Disposition: A | Discharge status: Home / Self Care | Payer: 59 | Source: Ambulatory Visit | Attending: Family Medicine | Admitting: Family Medicine

## 2021-04-14 DIAGNOSIS — M19071 Primary osteoarthritis, right ankle and foot: Secondary | ICD-10-CM

## 2021-04-16 ENCOUNTER — Emergency Department (HOSPITAL_COMMUNITY)
Admission: EM | Admit: 2021-04-16 | Discharge: 2021-04-17 | Disposition: A | Discharge status: Home / Self Care | Payer: 59 | Attending: Emergency Medicine | Admitting: Emergency Medicine

## 2021-04-16 ENCOUNTER — Encounter (HOSPITAL_COMMUNITY): Payer: Self-pay | Admitting: Emergency Medicine

## 2021-04-16 DIAGNOSIS — Z87891 Personal history of nicotine dependence: Secondary | ICD-10-CM | POA: Insufficient documentation

## 2021-04-16 DIAGNOSIS — R799 Abnormal finding of blood chemistry, unspecified: Secondary | ICD-10-CM | POA: Diagnosis present

## 2021-04-16 DIAGNOSIS — Z711 Person with feared health complaint in whom no diagnosis is made: Secondary | ICD-10-CM

## 2021-04-16 LAB — I-STAT CHEM 8, ED
BUN: 10 mg/dL (ref 6–20)
Calcium, Ion: 1.23 mmol/L (ref 1.15–1.40)
Chloride: 102 mmol/L (ref 98–111)
Creatinine, Ser: 1 mg/dL (ref 0.44–1.00)
Glucose, Bld: 109 mg/dL — ABNORMAL HIGH (ref 70–99)
HCT: 43 % (ref 36.0–46.0)
Hemoglobin: 14.6 g/dL (ref 12.0–15.0)
Potassium: 3.8 mmol/L (ref 3.5–5.1)
Sodium: 141 mmol/L (ref 135–145)
TCO2: 29 mmol/L (ref 22–32)

## 2021-04-16 LAB — COMPREHENSIVE METABOLIC PANEL
ALT: 13 U/L (ref 0–44)
AST: 18 U/L (ref 15–41)
Albumin: 3.8 g/dL (ref 3.5–5.0)
Alkaline Phosphatase: 62 U/L (ref 38–126)
Anion gap: 8 (ref 5–15)
BUN: 11 mg/dL (ref 6–20)
CO2: 28 mmol/L (ref 22–32)
Calcium: 9.4 mg/dL (ref 8.9–10.3)
Chloride: 104 mmol/L (ref 98–111)
Creatinine, Ser: 0.93 mg/dL (ref 0.44–1.00)
GFR, Estimated: >60 mL/min (ref >60)
Glucose, Bld: 106 mg/dL — ABNORMAL HIGH (ref 70–99)
Potassium: 4 mmol/L (ref 3.5–5.1)
Sodium: 140 mmol/L (ref 135–145)
Total Bilirubin: 0.4 mg/dL (ref 0.3–1.2)
Total Protein: 6.9 g/dL (ref 6.5–8.1)

## 2021-04-16 LAB — CBC WITH DIFFERENTIAL/PLATELET
Abs Immature Granulocytes: 0.06 10*3/uL (ref 0.00–0.07)
Basophils Absolute: 0.1 10*3/uL (ref 0.0–0.1)
Basophils Relative: 0 %
Eosinophils Absolute: 0.3 10*3/uL (ref 0.0–0.5)
Eosinophils Relative: 2 %
HCT: 43.6 % (ref 36.0–46.0)
Hemoglobin: 13.1 g/dL (ref 12.0–15.0)
Immature Granulocytes: 0 %
Lymphocytes Relative: 33 %
Lymphs Abs: 4.8 10*3/uL — ABNORMAL HIGH (ref 0.7–4.0)
MCH: 21.9 pg — ABNORMAL LOW (ref 26.0–34.0)
MCHC: 30 g/dL (ref 30.0–36.0)
MCV: 73 fL — ABNORMAL LOW (ref 80.0–100.0)
Monocytes Absolute: 0.7 10*3/uL (ref 0.1–1.0)
Monocytes Relative: 5 %
Neutro Abs: 8.8 10*3/uL — ABNORMAL HIGH (ref 1.7–7.7)
Neutrophils Relative %: 60 %
Platelets: 322 10*3/uL (ref 150–400)
RBC: 5.97 MIL/uL — ABNORMAL HIGH (ref 3.87–5.11)
RDW: 15.5 % (ref 11.5–15.5)
WBC: 14.7 10*3/uL — ABNORMAL HIGH (ref 4.0–10.5)
nRBC: 0 % (ref 0.0–0.2)

## 2021-04-16 NOTE — ED Triage Notes (Signed)
Patient sent by PCP for further evaluation of potassium 7.6 drawn yesterday.

## 2021-04-16 NOTE — ED Provider Notes (Signed)
Emergency Medicine Provider Triage Evaluation Note  Gina Meyers , a 60 y.o. female  was evaluated in triage.  Pt complains of hyperkalemia.  Patient states that she has been experiencing muscle aches in the lower legs for the past month.  Her PCP checked her basic labs and called her today and told her that her potassium was 7.6, which was drawn yesterday.  No history of hyperkalemia.  She takes estradiol but otherwise no regular medications.  Denies any chest pain, shortness of breath, or palpitations.  Physical Exam  BP 114/80 (BP Location: Right Arm)   Pulse 77   Temp 98.5 F (36.9 C) (Oral)   Resp 19   SpO2 98%  Gen:   Awake, no distress   Resp:  Normal effort  MSK:   Moves extremities without difficulty  Other:    Medical Decision Making  Medically screening exam initiated at 6:05 PM.  Appropriate orders placed.  Gina Meyers was informed that the remainder of the evaluation will be completed by another provider, this initial triage assessment does not replace that evaluation, and the importance of remaining in the ED until their evaluation is complete.   Gina Sou, PA-C 04/16/21 1805    Gina Files, MD 04/17/21 1025

## 2021-04-17 NOTE — ED Provider Notes (Signed)
[facility]-EMERGENCY DEPT Provider Note   CSN: [id number] Arrival date & time: 04/16/21  1736     History Chief Complaint  Patient presents with   Abnormal Lab    Gina Meyers is a 60 y.o. female.  Patient is a 60 year old female with history of anxiety, prediabetes.  Patient presenting today for evaluation of abnormal potassium levels.  Patient had routine laboratory studies performed yesterday at her primary doctor's office.  She was called today and told her level was 7.6 and she needed to come to the ER to be evaluated.  Patient has no other complaints.  The history is provided by the patient.      Past Medical History:  Diagnosis Date   Anxiety    Depression    Diabetes mellitus without complication (HCC)    pre diabetic   Herpes    Seasonal allergies     Patient Active Problem List   Diagnosis Date Noted   Anxiety disorder 06/15/2019   Healthcare maintenance 06/15/2019   Prediabetes 06/15/2019   Palpitations 12/30/2013   Chest pain 12/30/2013    Past Surgical History:  Procedure Laterality Date   CESAREAN SECTION     CYST EXCISION     TUBAL LIGATION       OB History   No obstetric history on file.     Family History  Problem Relation Age of Onset   Diabetes Mother    Heart defect Father        some valvular issue    Social History   Tobacco Use   Smoking status: Former    Packs/day: 0.25    Types: Cigarettes    Start date: 56    Quit date: 1995    Years since quitting: 27.5   Smokeless tobacco: Never  Vaping Use   Vaping Use: Never used  Substance Use Topics   Alcohol use: Not Currently    Comment: rare   Drug use: No    Home Medications Prior to Admission medications   Medication Sig Start Date End Date Taking? Authorizing Provider  albuterol (PROVENTIL HFA;VENTOLIN HFA) 108 (90 BASE) MCG/ACT inhaler Inhale 1-2 puffs into the lungs every 6 (six) hours as needed for wheezing or shortness of breath.     [provider]  estradiol (ESTRACE) 1 MG tablet Take 1 mg by mouth daily.    [provider]  fluticasone (FLONASE) 50 MCG/ACT nasal spray Place into both nostrils daily.    [provider]  meloxicam (MOBIC) 15 MG tablet Take 1 tablet (15 mg total) by mouth daily. 10/11/19   Wallis Bamberg, PA-C  predniSONE (DELTASONE) 10 MG tablet Begin with 6 tabs on day 1, 5 tab on day 2, 4 tab on day 3, 3 tab on day 4, 2 tab on day 5, 1 tab on day 6-take with food 11/28/19   Wieters, Hallie C, PA-C  triamcinolone cream (KENALOG) 0.1 % Apply 1 application topically 2 (two) times daily. 11/28/19   Wieters, Hallie C, PA-C    Allergies    Sulfa antibiotics  Review of Systems   Review of Systems  All other systems reviewed and are negative.  Physical Exam Updated Vital Signs BP 114/80 (BP Location: Right Arm)   Pulse 77   Temp 98.5 F (36.9 C) (Oral)   Resp 19   SpO2 98%   Physical Exam Vitals and nursing note reviewed.  Constitutional:      General: She is not in acute distress.  Appearance: Normal appearance. She is not ill-appearing.  HENT:     Head: Normocephalic and atraumatic.  Pulmonary:     Effort: Pulmonary effort is normal.  Skin:    General: Skin is warm and dry.  Neurological:     General: No focal deficit present.     Mental Status: She is alert and oriented to person, place, and time.    ED Results / Procedures / Treatments   Labs (all labs ordered are listed, but only abnormal results are displayed) Labs Reviewed  COMPREHENSIVE METABOLIC PANEL - Abnormal; Notable for the following components:      Result Value   Glucose, Bld 106 (*)    All other components within normal limits  CBC WITH DIFFERENTIAL/PLATELET - Abnormal; Notable for the following components:   WBC 14.7 (*)    RBC 5.97 (*)    MCV 73.0 (*)    MCH 21.9 (*)    Neutro Abs 8.8 (*)    Lymphs Abs 4.8 (*)    All other components within normal limits  I-STAT CHEM 8, ED - Abnormal;  Notable for the following components:   Glucose, Bld 109 (*)    All other components within normal limits    EKG None  Radiology No results found.  Procedures Procedures   Medications Ordered in ED Medications - No data to display  ED Course  I have reviewed the triage vital signs and the nursing notes.  Pertinent labs & imaging results that were available during my care of the patient were reviewed by me and considered in my medical decision making (see chart for details).    MDM Rules/Calculators/A&P  Patient's laboratory studies show a potassium of 4.0.  She has no complaints.  The specimen from yesterday was most certainly hemolyzed as this is the only explanation for this elevated level.  Patient has normal renal function and laboratory studies are all otherwise unremarkable.  She will be discharged with as needed follow-up.  Final Clinical Impression(s) / ED Diagnoses Final diagnoses:  None    Rx / DC Orders ED Discharge Orders     None        Geoffery Lyons, MD 04/17/21 0230

## 2021-04-17 NOTE — Discharge Instructions (Addendum)
Follow-up with your primary doctor as scheduled and return to the ER if you experience any concerning issues.

## 2021-04-24 ENCOUNTER — Encounter: Payer: Self-pay | Admitting: Sports Medicine

## 2021-04-24 ENCOUNTER — Ambulatory Visit (INDEPENDENT_AMBULATORY_CARE_PROVIDER_SITE_OTHER): Payer: 59 | Admitting: Sports Medicine

## 2021-04-24 ENCOUNTER — Other Ambulatory Visit: Payer: Self-pay

## 2021-04-24 DIAGNOSIS — M722 Plantar fascial fibromatosis: Secondary | ICD-10-CM | POA: Diagnosis not present

## 2021-04-24 DIAGNOSIS — M25571 Pain in right ankle and joints of right foot: Secondary | ICD-10-CM

## 2021-04-24 DIAGNOSIS — M79672 Pain in left foot: Secondary | ICD-10-CM

## 2021-04-24 DIAGNOSIS — G8929 Other chronic pain: Secondary | ICD-10-CM

## 2021-04-24 DIAGNOSIS — M216X1 Other acquired deformities of right foot: Secondary | ICD-10-CM | POA: Diagnosis not present

## 2021-04-24 DIAGNOSIS — M79671 Pain in right foot: Secondary | ICD-10-CM

## 2021-04-24 DIAGNOSIS — R1031 Right lower quadrant pain: Secondary | ICD-10-CM | POA: Insufficient documentation

## 2021-04-24 DIAGNOSIS — M216X2 Other acquired deformities of left foot: Secondary | ICD-10-CM

## 2021-04-24 DIAGNOSIS — M25572 Pain in left ankle and joints of left foot: Secondary | ICD-10-CM

## 2021-04-24 MED ORDER — PREDNISONE 10 MG (21) PO TBPK: ORAL_TABLET | ORAL | 0 refills | Status: AC

## 2021-04-24 NOTE — Progress Notes (Signed)
Subjective: Gina Meyers is a 60 y.o. female patient presents to office with complaint of foot pain all over states is been going on for a year the entire bottoms of both feet and ankles right worse than left states that she works in [employer] and stands 3 to 4 hours a day and states that she has tried over-the-counter insoles topical rubs soaking some stretching with no relief states that when she wears shoes with heels it does seem to help.  Patient even reports that she was on meloxicam for this and still did not get any relief.  No other pedal complaints noted.  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     ALPRAZolam (XANAX) 0.5 MG tablet alprazolam 0.5 mg tablet     azelastine (ASTELIN) 0.1 % nasal spray azelastine 137 mcg (0.1 %) nasal spray aerosol     buPROPion (WELLBUTRIN XL) 150 MG 24 hr tablet bupropion HCl XL 150 mg 24 hr tablet, extended release     estradiol (ESTRACE) 1 MG tablet Take 1 mg by mouth daily.     fluticasone (FLONASE) 50 MCG/ACT nasal spray Place into both  nostrils daily.     meloxicam (MOBIC) 15 MG tablet Take 1 tablet (15 mg total) by mouth daily. 60 tablet 0   omeprazole (PRILOSEC) 40 MG capsule omeprazole 40 mg capsule,delayed release  TAKE 1 CAPSULE BY MOUTH ONCE DAILY AND TWICE DAILY WHEN NEEDED 30 MINUTES BEFORE MEALS     triamcinolone cream (KENALOG) 0.1 % Apply 1 application topically 2 (two) times daily. 45 g 0   No current facility-administered medications on file prior to visit.    Allergies  Allergen Reactions   Sulfa Antibiotics Hives   Sulfasalazine Hives    Objective: Physical Exam General: The patient is alert and oriented x3 in no acute distress.  Dermatology: Skin is warm, dry and supple bilateral lower extremities. Nails 1-10 are normal. There is no erythema, edema, no eccymosis, no open lesions present. Integument is otherwise unremarkable.  Vascular: Dorsalis Pedis pulse and Posterior Tibial pulse are 2/4 bilateral. Capillary fill time is immediate to all digits.  Neurological: Grossly intact to light touch bilateral.  Musculoskeletal: Tenderness to palpation at the medial calcaneal tubercale and through the insertion of the plantar fascia on the right greater than left foot. No pain with compression of calcaneus bilateral. No pain with tuning fork to calcaneus bilateral. No pain with calf compression bilateral. There is decreased Ankle joint range of motion bilateral.  Diffuse pain with palpation to ankles bilateral.  All other joints range of motion within normal limits bilateral. Strength 5/5 in all  groups bilateral.   Assessment and Plan: Problem List Items Addressed This Visit   None Visit Diagnoses     Plantar fasciitis, bilateral    -  Primary   Bilateral foot pain       Chronic pain of both ankles       Acquired equinus deformity of both feet           -Complete examination performed.  -Xrays reviewed as in chart performed on 04/14/2021 -Discussed with patient in detail the condition of plantar  fasciitis and likely compensation ankle pain, how this occurs and general treatment options. Explained both conservative and surgical treatments.  -Rx prednisone Dosepak for patient to take since she has multiple tender points if no better at next visit then we will attempt steroid injection -Recommended good supportive shoes and advised use of bilateral fascial braces as dispensed this visit and advised patient to use heel lifts especially in shoes that are flat -Explained and dispensed to patient daily stretching exercises. -Recommend patient to ice affected area 1-2x daily. -Patient to return to office in 3-4 weeks for follow up or sooner if problems or questions arise.  Asencion Islam, DPM

## 2021-05-22 ENCOUNTER — Ambulatory Visit: Payer: 59 | Admitting: Sports Medicine

## 2021-06-19 ENCOUNTER — Ambulatory Visit: Payer: 59 | Admitting: Sports Medicine

## 2022-10-23 IMAGING — CR DG ANKLE COMPLETE 3+V*L*
3 series · 3 of 3 positions shown · non-contrast
Comparison: None.

CLINICAL DATA: Bilateral ankle pain

EXAM:
LEFT ANKLE COMPLETE - 3+ VIEW

[x ankle ap left]
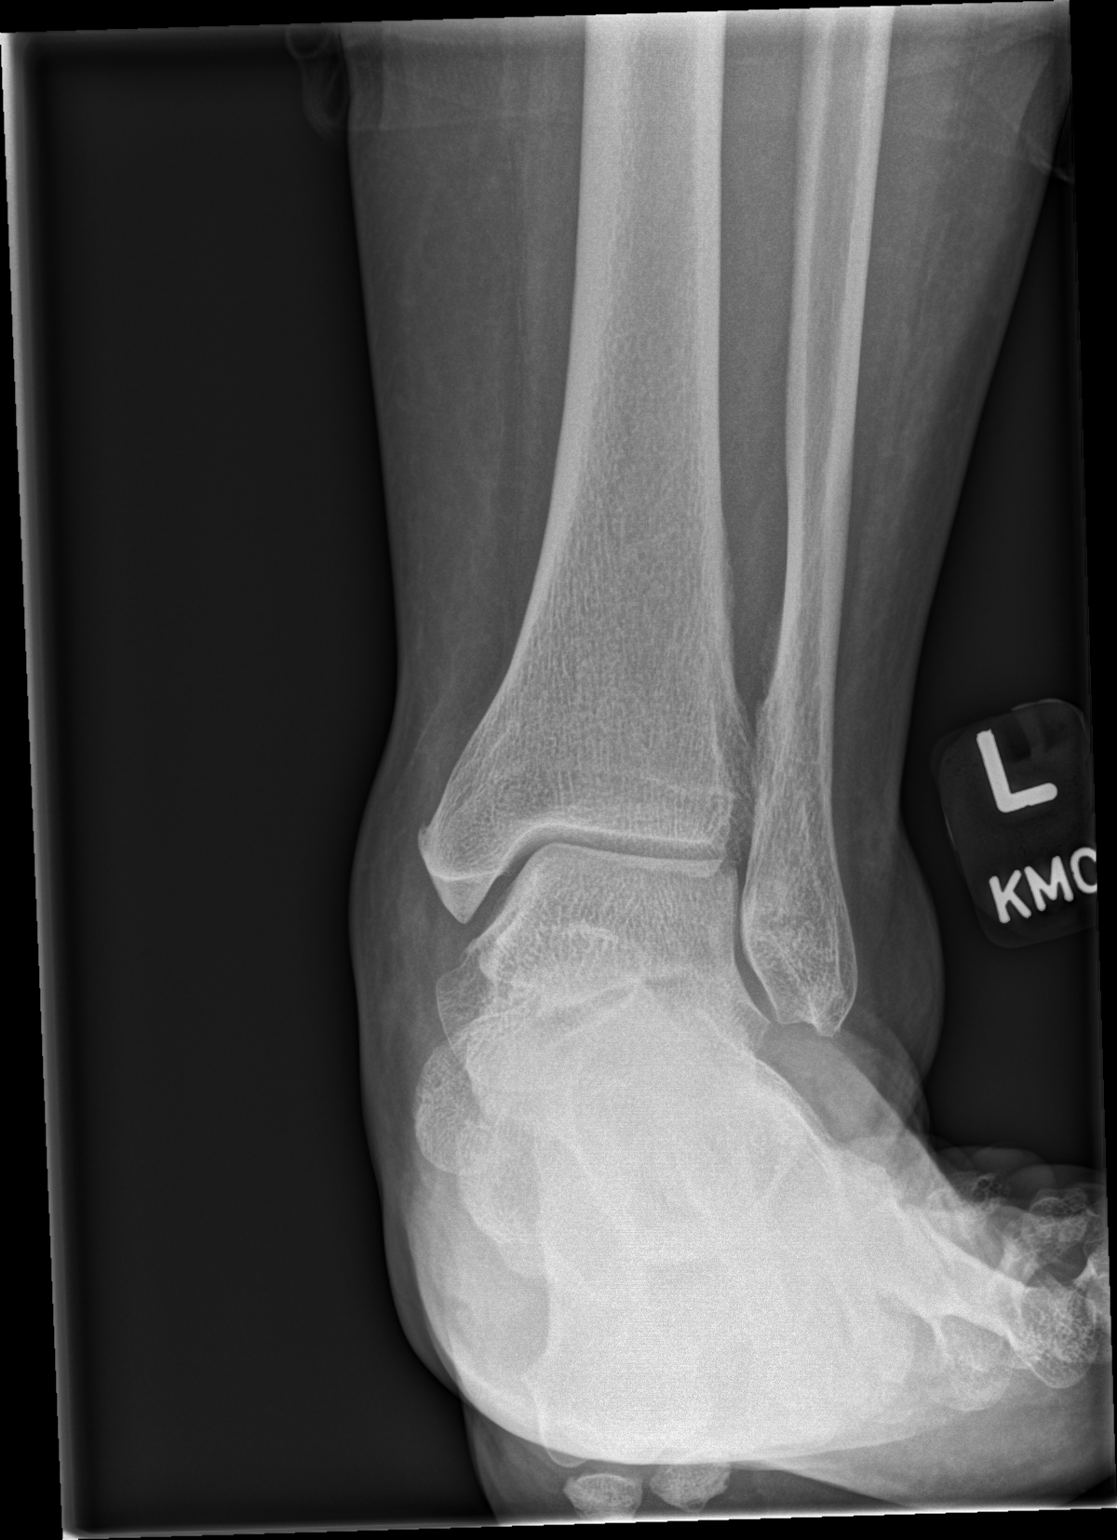

[x ankle obl left]
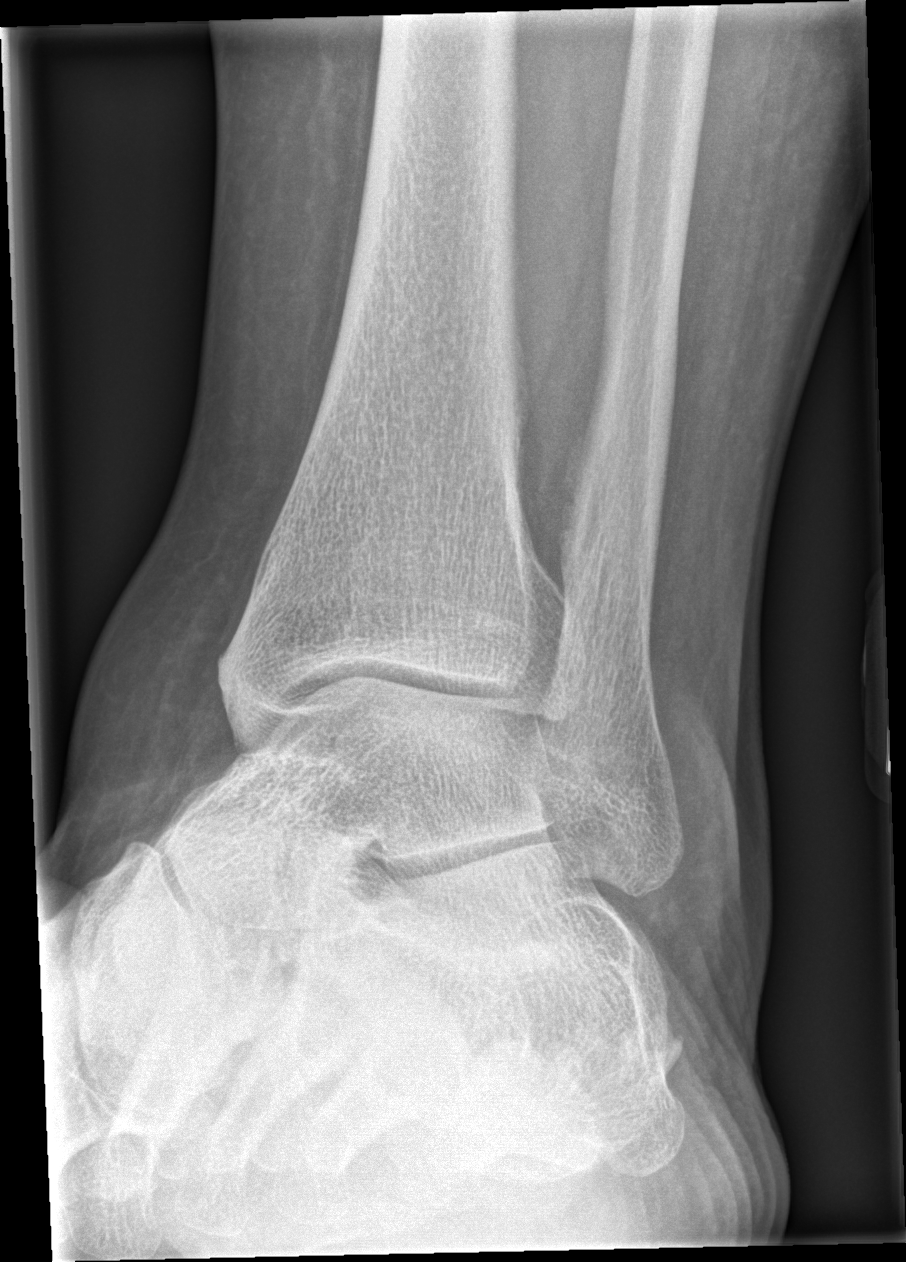

[x ankle lat left]
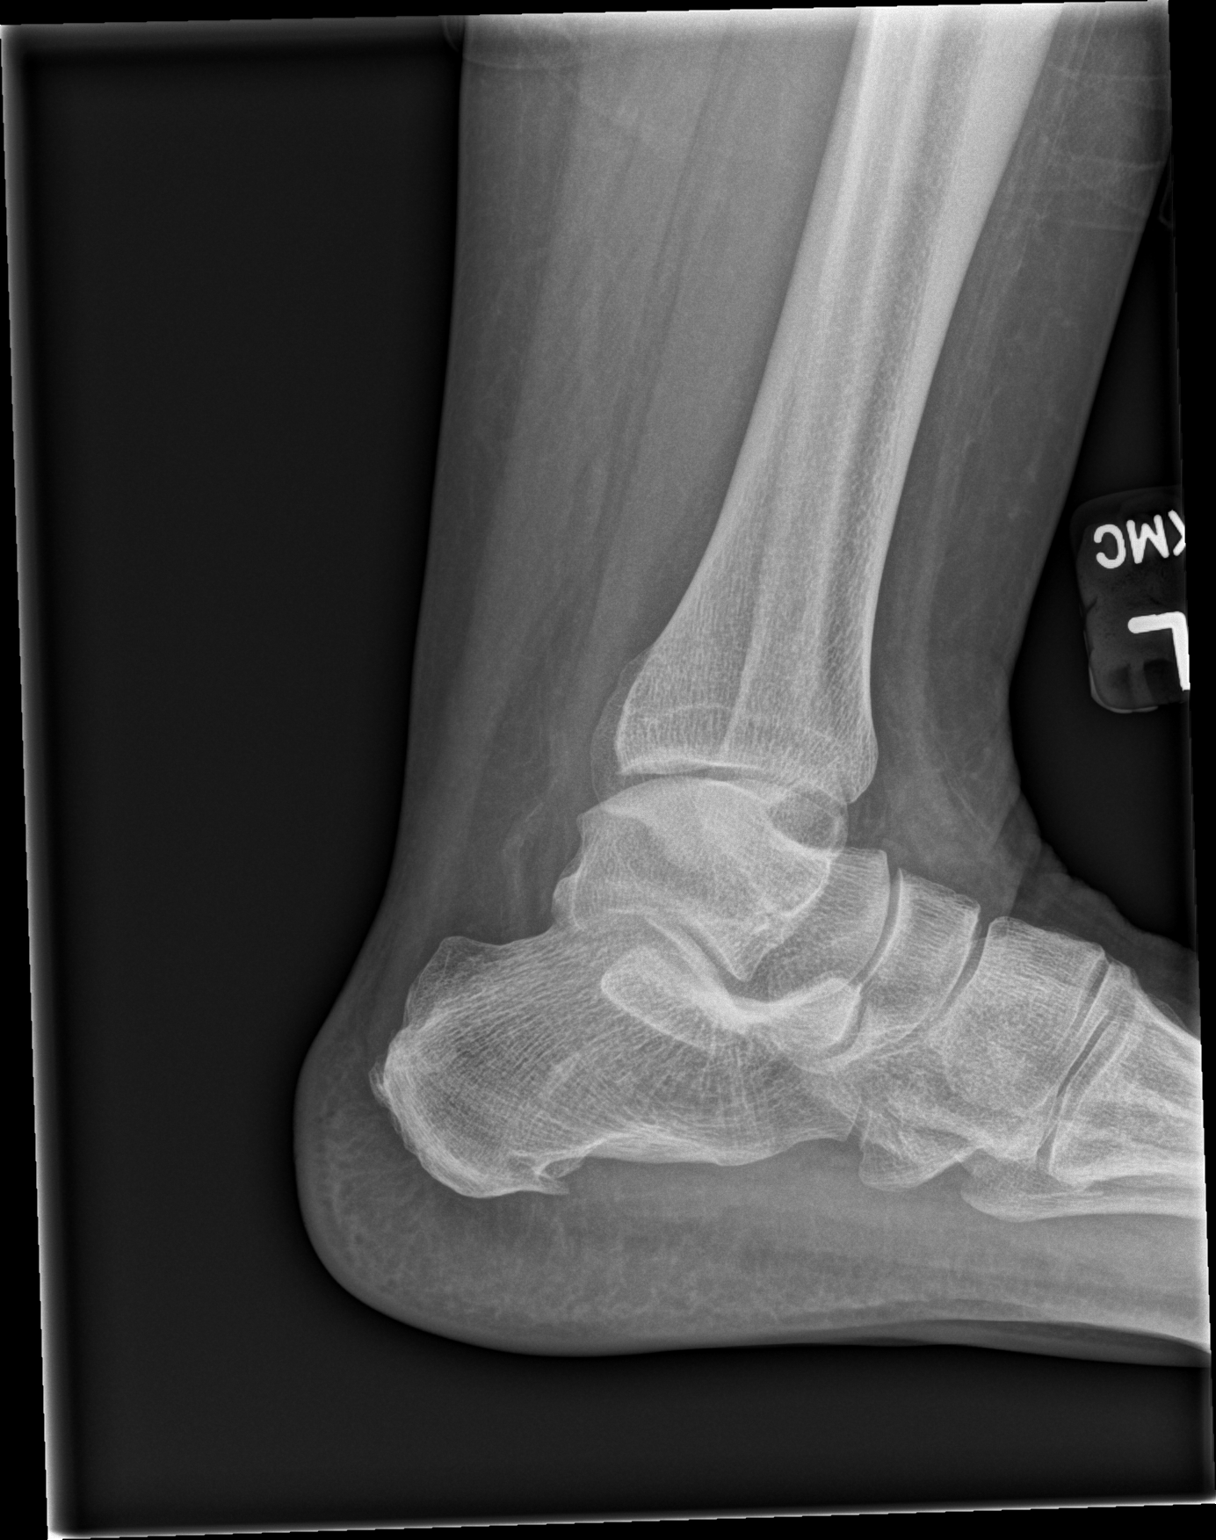

[3 of 3 positions shown; findings below may reference images not displayed]

FINDINGS: No acute fracture or dislocation. No aggressive osseous lesion.
Normal alignment. Plantar calcaneal spur bilaterally. Mild
enthesopathic changes of the Achilles tendon insertion bilaterally.

Soft tissue are unremarkable. No radiopaque foreign body or soft
tissue emphysema.
IMPRESSION: No acute osseous injury of the left ankle.

## 2022-10-23 IMAGING — CR DG ANKLE COMPLETE 3+V*R*
3 series · 3 of 3 positions shown · non-contrast
Comparison: None.

CLINICAL DATA: Bilateral ankle pain

EXAM:
RIGHT ANKLE - COMPLETE 3+ VIEW

[x ankle ap right]
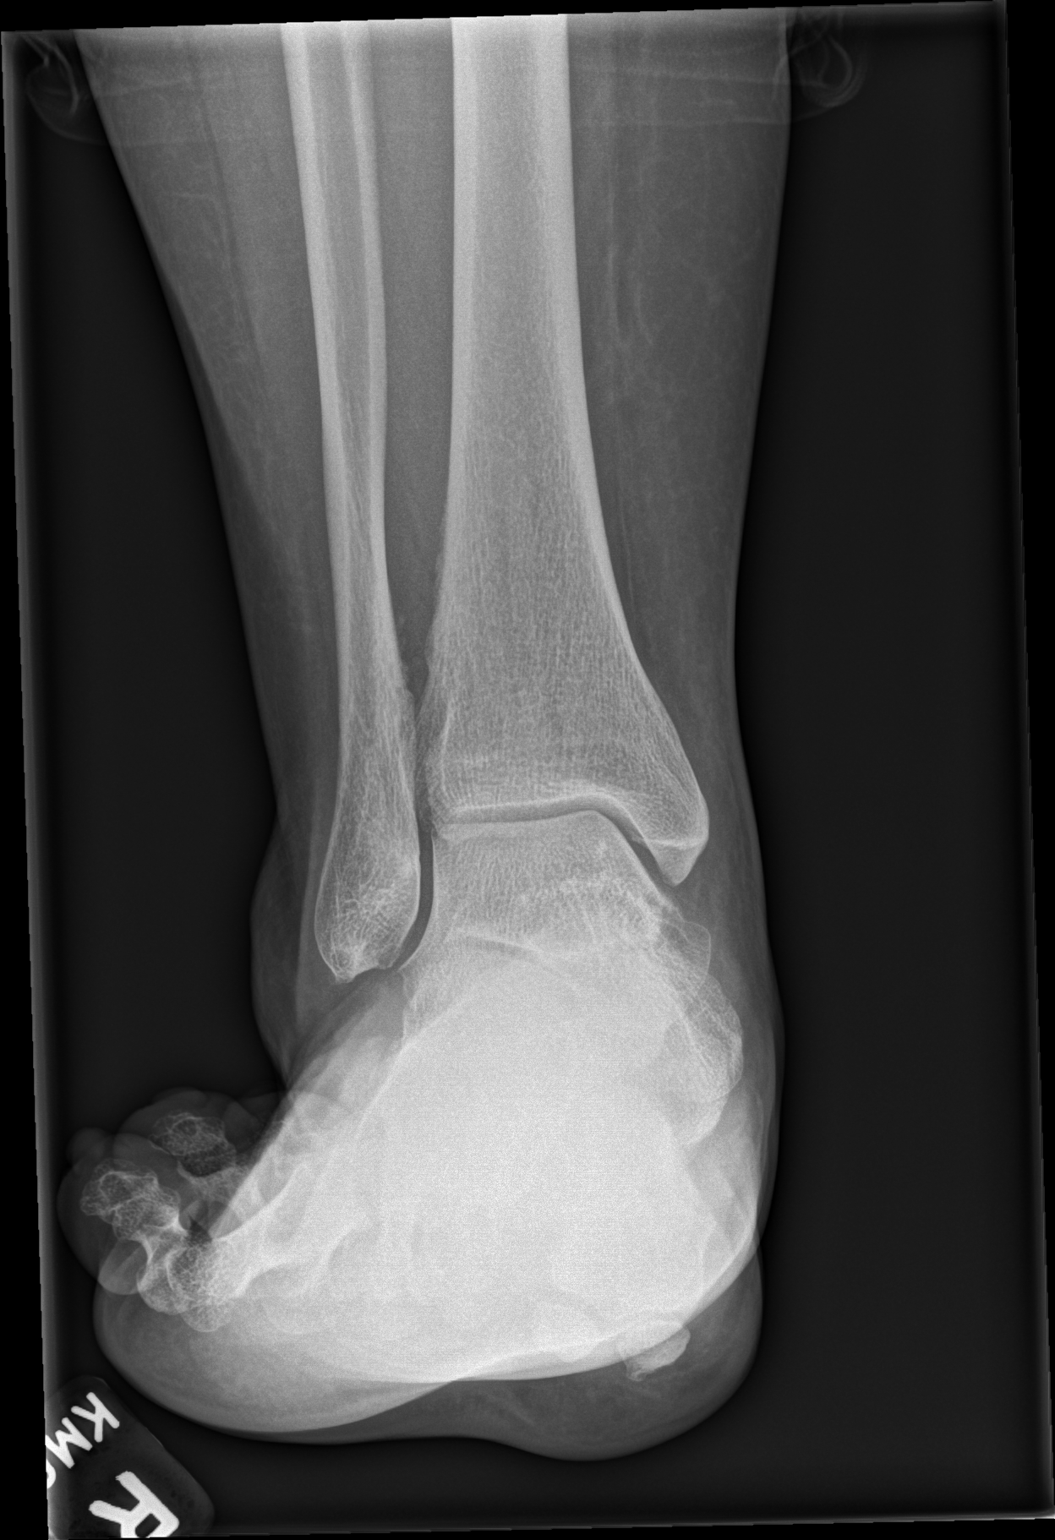

[x ankle obl right]
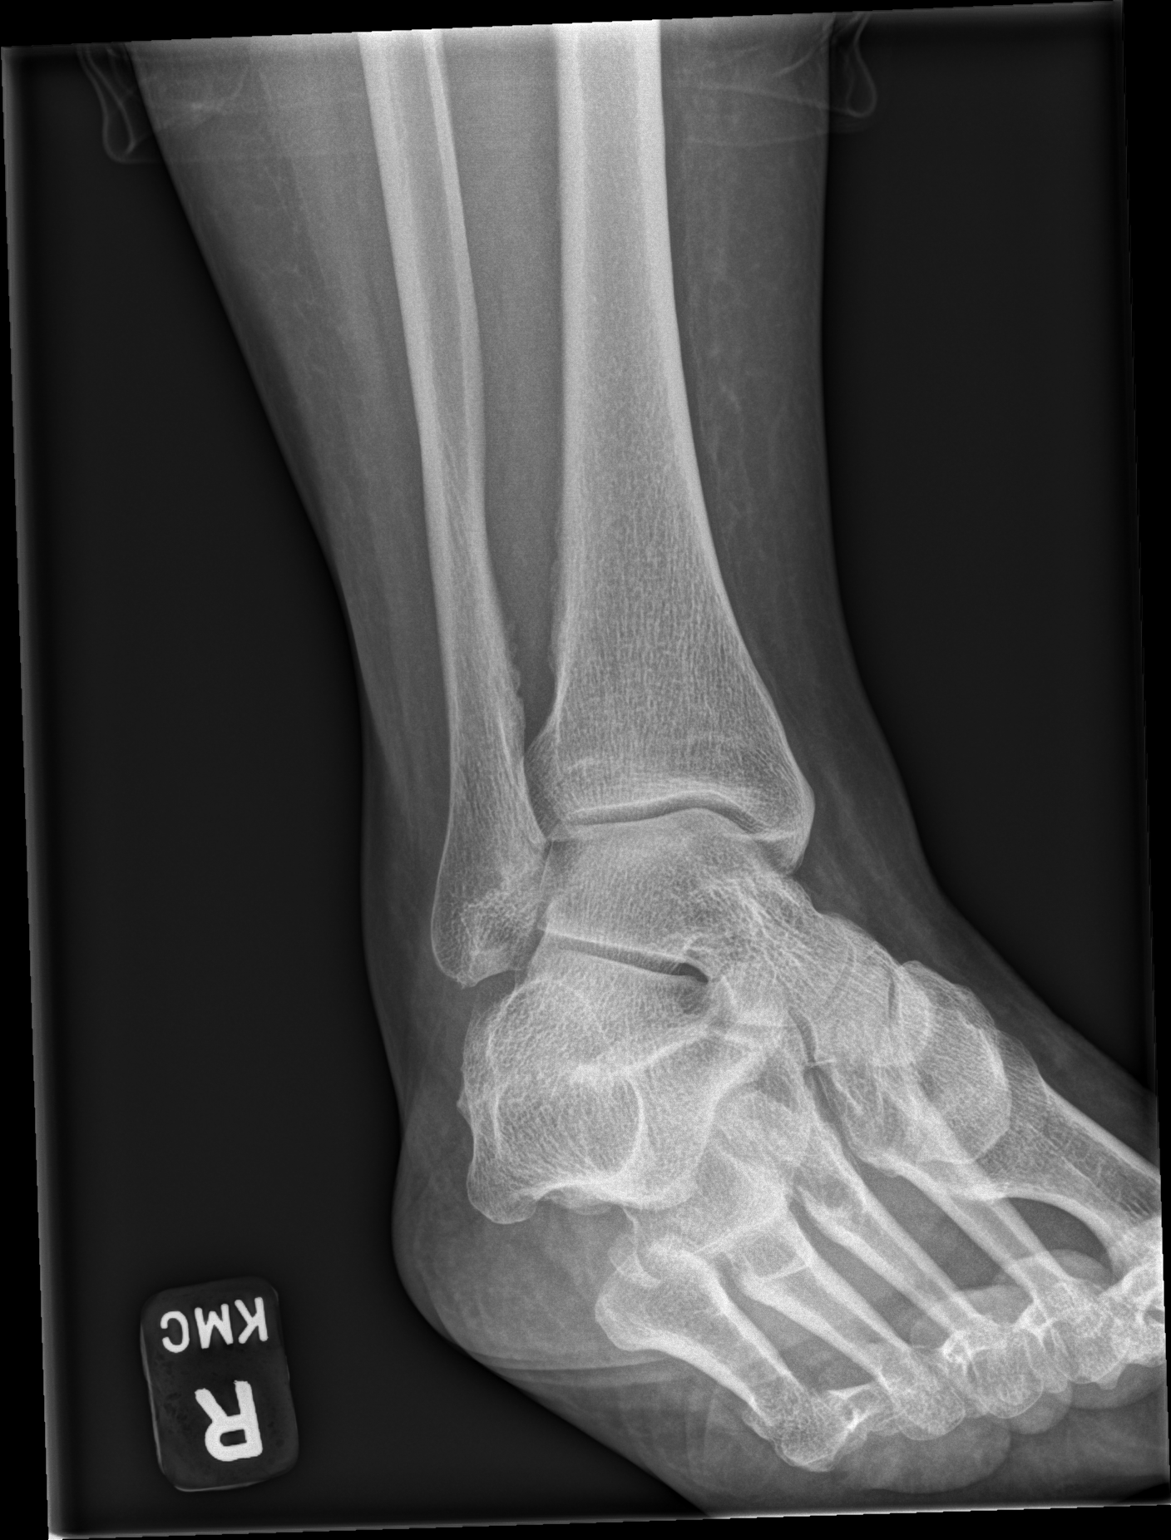

[x ankle lat right]
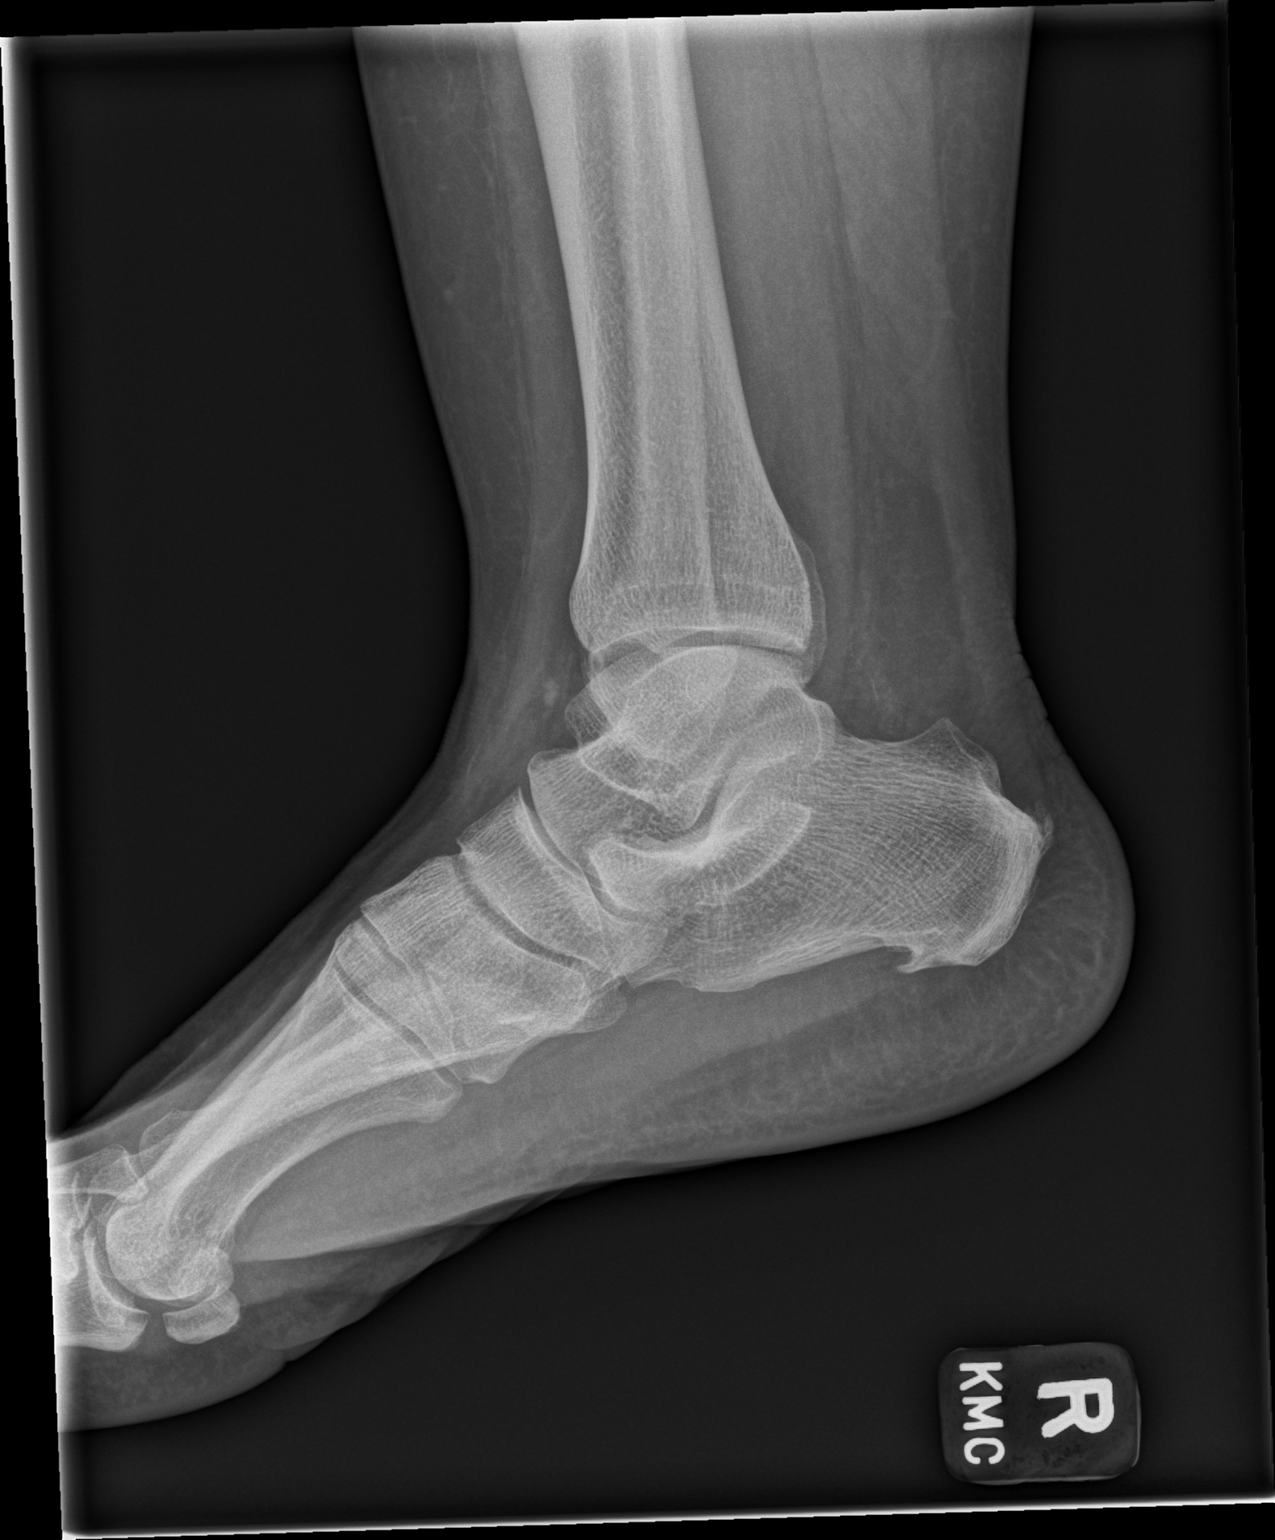

[3 of 3 positions shown; findings below may reference images not displayed]

FINDINGS: No acute fracture or dislocation. No aggressive osseous lesion.
Normal alignment. Plantar calcaneal spur bilaterally. Mild
enthesopathic changes of the Achilles tendon insertion bilaterally.

Soft tissue are unremarkable. No radiopaque foreign body or soft
tissue emphysema.
IMPRESSION: No acute osseous injury of the right ankle.
